# Patient Record
Sex: Female | Born: 1980 | Race: White | Hispanic: No | Marital: Married | Smoking: Never smoker
Health system: Southern US, Community
[De-identification: ages and names within clinical notes are randomized; demographics above are authoritative.]

## PROBLEM LIST (undated history)

## (undated) ENCOUNTER — Inpatient Hospital Stay (HOSPITAL_COMMUNITY): Payer: Self-pay

## (undated) DIAGNOSIS — F329 Major depressive disorder, single episode, unspecified: Secondary | ICD-10-CM

## (undated) DIAGNOSIS — R51 Headache: Secondary | ICD-10-CM

## (undated) DIAGNOSIS — R519 Headache, unspecified: Secondary | ICD-10-CM

## (undated) DIAGNOSIS — Z789 Other specified health status: Secondary | ICD-10-CM

## (undated) DIAGNOSIS — F32A Depression, unspecified: Secondary | ICD-10-CM

## (undated) HISTORY — PX: APPENDECTOMY: SHX54

## (undated) HISTORY — PX: WISDOM TOOTH EXTRACTION: SHX21

---

## 2015-06-14 ENCOUNTER — Encounter (HOSPITAL_COMMUNITY): Payer: Self-pay | Admitting: *Deleted

## 2015-06-14 ENCOUNTER — Emergency Department (HOSPITAL_COMMUNITY): Payer: Self-pay

## 2015-06-14 ENCOUNTER — Emergency Department (HOSPITAL_COMMUNITY)
Admission: EM | Admit: 2015-06-14 | Discharge: 2015-06-14 | Disposition: A | Discharge status: Home / Self Care | Payer: Self-pay | Attending: Emergency Medicine | Admitting: Emergency Medicine

## 2015-06-14 DIAGNOSIS — N939 Abnormal uterine and vaginal bleeding, unspecified: Secondary | ICD-10-CM

## 2015-06-14 DIAGNOSIS — O039 Complete or unspecified spontaneous abortion without complication: Secondary | ICD-10-CM | POA: Insufficient documentation

## 2015-06-14 DIAGNOSIS — Z3A01 Less than 8 weeks gestation of pregnancy: Secondary | ICD-10-CM | POA: Insufficient documentation

## 2015-06-14 LAB — CBC WITH DIFFERENTIAL/PLATELET
BASOS PCT: 0 % (ref 0–1)
Basophils Absolute: 0 10*3/uL (ref 0.0–0.1)
Eosinophils Absolute: 0.1 10*3/uL (ref 0.0–0.7)
Eosinophils Relative: 1 % (ref 0–5)
HEMATOCRIT: 38.7 % (ref 36.0–46.0)
HEMOGLOBIN: 13.3 g/dL (ref 12.0–15.0)
LYMPHS ABS: 1.5 10*3/uL (ref 0.7–4.0)
LYMPHS PCT: 17 % (ref 12–46)
MCH: 31.7 pg (ref 26.0–34.0)
MCHC: 34.4 g/dL (ref 30.0–36.0)
MCV: 92.1 fL (ref 78.0–100.0)
MONO ABS: 0.4 10*3/uL (ref 0.1–1.0)
Monocytes Relative: 5 % (ref 3–12)
Neutro Abs: 7.2 10*3/uL (ref 1.7–7.7)
Neutrophils Relative %: 77 % (ref 43–77)
Platelets: 275 10*3/uL (ref 150–400)
RBC: 4.2 MIL/uL (ref 3.87–5.11)
RDW: 12.1 % (ref 11.5–15.5)
WBC: 9.1 10*3/uL (ref 4.0–10.5)

## 2015-06-14 LAB — BASIC METABOLIC PANEL
Anion gap: 11 (ref 5–15)
BUN: 6 mg/dL (ref 6–20)
CALCIUM: 9.2 mg/dL (ref 8.9–10.3)
CHLORIDE: 105 mmol/L (ref 101–111)
CO2: 24 mmol/L (ref 22–32)
CREATININE: 0.69 mg/dL (ref 0.44–1.00)
GFR calc Af Amer: >60 mL/min (ref >60)
GFR calc non Af Amer: >60 mL/min (ref >60)
Glucose, Bld: 103 mg/dL — ABNORMAL HIGH (ref 65–99)
Potassium: 3.6 mmol/L (ref 3.5–5.1)
Sodium: 140 mmol/L (ref 135–145)

## 2015-06-14 LAB — I-STAT BETA HCG BLOOD, ED (MC, WL, AP ONLY): I-stat hCG, quantitative: >2000 m[IU]/mL — ABNORMAL HIGH (ref <5)

## 2015-06-14 LAB — HCG, QUANTITATIVE, PREGNANCY: HCG, BETA CHAIN, QUANT, S: 12374 m[IU]/mL — AB (ref <5)

## 2015-06-14 LAB — ABO/RH: ABO/RH(D): A POS

## 2015-06-14 MED ORDER — HYDROCODONE-ACETAMINOPHEN 5-325 MG PO TABS: 1.0000 | ORAL_TABLET | ORAL | Status: DC | PRN

## 2015-06-14 MED ORDER — NAPROXEN 500 MG PO TABS: 500.0000 mg | ORAL_TABLET | Freq: Two times a day (BID) | ORAL | Status: DC

## 2015-06-14 NOTE — ED Notes (Signed)
Pt reports bleeding and cramping starting on Tuesday. Pt states that she went to the restroom this morning and passed a large clot. Pt reports continued bleeding. Pt states last period was early June. Pt states that reports no OB appointment so far.

## 2015-06-14 NOTE — ED Notes (Signed)
Pt still in US

## 2015-06-14 NOTE — ED Notes (Signed)
Ultrasound called for status of ultrasound. States that she is next to go

## 2015-06-14 NOTE — ED Provider Notes (Signed)
CSN: [id number]     Arrival date & time 06/14/15  0825 History   First MD Initiated Contact with Patient 06/14/15 0830     Chief Complaint  Patient presents with  . Miscarriage   HPI Patient presents to the emergency room with complaints of vaginal bleeding.  Patient is G2 P0. Her last menstrual period was in the early part of June. Patient hasn't had a positive home pregnancy test. The patient has had some mild vaginal bleeding and cramping this past week. She does not have an OB doctor so she was monitoring it carefully. The symptoms were very mild so she was not that concerned. However, when she went to the restroom this morning she passed a large clot of blood. She also started having more serious lower abdominal cramping. The patient's concerned she is having a miscarriage. She denies any history of fevers or chills. No vomiting or diarrhea. History reviewed. No pertinent past medical history. History reviewed. No pertinent past surgical history. No family history on file. History  Substance Use Topics  . Smoking status: Never Smoker   . Smokeless tobacco: Not on file  . Alcohol Use: Not on file   OB History    No data available     Review of Systems  All other systems reviewed and are negative.     Allergies  Review of patient's allergies indicates no known allergies.  Home Medications   Prior to Admission medications   Medication Sig Start Date End Date Taking? Authorizing Provider  acetaminophen (TYLENOL) 325 MG tablet Take 650 mg by mouth every 6 (six) hours as needed for mild pain.   Yes Historical Provider, MD  HYDROcodone-acetaminophen (NORCO/VICODIN) 5-325 MG per tablet Take 1-2 tablets by mouth every 4 (four) hours as needed. 06/14/15   Linwood Dibbles, MD  naproxen (NAPROSYN) 500 MG tablet Take 1 tablet (500 mg total) by mouth 2 (two) times daily. 06/14/15   Linwood Dibbles, MD   BP 115/68 mmHg  Pulse 91  Temp(Src) 98.4 F (36.9 C) (Oral)  Resp 14  SpO2 100%  LMP  04/25/2015 Physical Exam  Constitutional: She appears well-developed and well-nourished. No distress.  HENT:  Head: Normocephalic and atraumatic.  Right Ear: External ear normal.  Left Ear: External ear normal.  Eyes: Conjunctivae are normal. Right eye exhibits no discharge. Left eye exhibits no discharge. No scleral icterus.  Neck: Neck supple. No tracheal deviation present.  Cardiovascular: Normal rate, regular rhythm and intact distal pulses.   Pulmonary/Chest: Effort normal and breath sounds normal. No stridor. No respiratory distress. She has no wheezes. She has no rales.  Abdominal: Soft. Bowel sounds are normal. She exhibits no distension. There is no tenderness. There is no rebound and no guarding.  Genitourinary: Uterus is tender. Right adnexum displays no mass. Left adnexum displays no mass. There is bleeding in the vagina.  Cervical os is opened, there appeared to be some tissue within the cervical os, several clots within the vaginal vault, small amount of actively oozing blood from the cervical os  Musculoskeletal: She exhibits no edema or tenderness.  Neurological: She is alert. She has normal strength. No cranial nerve deficit (no facial droop, extraocular movements intact, no slurred speech) or sensory deficit. She exhibits normal muscle tone. She displays no seizure activity. Coordination normal.  Skin: Skin is warm and dry. No rash noted.  Psychiatric: She has a normal mood and affect.  Nursing note and vitals reviewed.   ED Course  Procedures (including critical  care time) Labs Review Labs Reviewed  HCG, QUANTITATIVE, PREGNANCY - Abnormal; Notable for the following:    hCG, Beta Chain, Quant, S [id number] (*)    All other components within normal limits  BASIC METABOLIC PANEL - Abnormal; Notable for the following:    Glucose, Bld 103 (*)    All other components within normal limits  I-STAT BETA HCG BLOOD, ED ([facility], [facility], [facility] ONLY) - Abnormal; Notable for the following:     I-stat hCG, quantitative >2000.0 (*)    All other components within normal limits  CBC WITH DIFFERENTIAL/PLATELET  ABO/RH  SURGICAL PATHOLOGY    Imaging Review US Ob Comp Less 14 Wks  06/14/2015   CLINICAL DATA:  Pregnant patient with bleeding and cramping since 06/10/2015. Quantitative HCG 12,374. Initial encounter.  EXAM: OBSTETRIC <14 WK [place] AND TRANSVAGINAL OB US  TECHNIQUE: Both transabdominal and transvaginal ultrasound examinations were performed for complete evaluation of the gestation as well as the maternal uterus, adnexal regions, and pelvic cul-de-sac. Transvaginal technique was performed to assess early pregnancy.  COMPARISON:  None.  FINDINGS: Intrauterine gestational sac: Not visualized. Endometrial stripe thickness is 13 mm.  Yolk sac:  Not visualized.  Embryo:  Not visualized.  Cardiac Activity: Not applicable.  Maternal uterus/adnexae: Appear normal.  IMPRESSION: No ultrasound evidence of pregnancy most suggestive of completed abortion. Follow-up quantitative HCG is recommended.   Electronically Signed   By: Drusilla Kanner M.D.   On: 06/14/2015 11:51   US Ob Transvaginal  06/14/2015   CLINICAL DATA:  Pregnant patient with bleeding and cramping since 06/10/2015. Quantitative HCG 12,374. Initial encounter.  EXAM: OBSTETRIC <14 WK [place] AND TRANSVAGINAL OB US  TECHNIQUE: Both transabdominal and transvaginal ultrasound examinations were performed for complete evaluation of the gestation as well as the maternal uterus, adnexal regions, and pelvic cul-de-sac. Transvaginal technique was performed to assess early pregnancy.  COMPARISON:  None.  FINDINGS: Intrauterine gestational sac: Not visualized. Endometrial stripe thickness is 13 mm.  Yolk sac:  Not visualized.  Embryo:  Not visualized.  Cardiac Activity: Not applicable.  Maternal uterus/adnexae: Appear normal.  IMPRESSION: No ultrasound evidence of pregnancy most suggestive of completed abortion. Follow-up quantitative HCG is recommended.    Electronically Signed   By: Drusilla Kanner M.D.   On: 06/14/2015 11:51      MDM   Final diagnoses:  Complete miscarriage    The patient's exam and ultrasound is most consistent with a completed miscarriage. Patient did pass tissue that we saved as a specimen and sent to pathology for evaluation. The patient's bleeding had slowed down during her stay in the emergency department.  I spoke with Victorino Dike NP at [facility] hospital working with Dr Debroah Loop.  Will have pt follow up on Sunday at [facility]U for repeat quant.   At this time there does not appear to be any evidence of an acute emergency medical condition and the patient appears stable for discharge with appropriate outpatient follow up.     Linwood Dibbles, MD 06/14/15 1240

## 2015-06-14 NOTE — Discharge Instructions (Signed)
Miscarriage A miscarriage is the sudden loss of an unborn baby (fetus) before the 20th week of pregnancy. Most miscarriages happen in the first 3 months of pregnancy. Sometimes, it happens before a woman even knows she is pregnant. A miscarriage is also called a "spontaneous miscarriage" or "early pregnancy loss." Having a miscarriage can be an emotional experience. Talk with your caregiver about any questions you may have about miscarrying, the grieving process, and your future pregnancy plans. CAUSES   Problems with the fetal chromosomes that make it impossible for the baby to develop normally. Problems with the baby's genes or chromosomes are most often the result of errors that occur, by chance, as the embryo divides and grows. The problems are not inherited from the parents.  Infection of the cervix or uterus.   Hormone problems.   Problems with the cervix, such as having an incompetent cervix. This is when the tissue in the cervix is not strong enough to hold the pregnancy.   Problems with the uterus, such as an abnormally shaped uterus, uterine fibroids, or congenital abnormalities.   Certain medical conditions.   Smoking, drinking alcohol, or taking illegal drugs.   Trauma.  Often, the cause of a miscarriage is unknown.  SYMPTOMS   Vaginal bleeding or spotting, with or without cramps or pain.  Pain or cramping in the abdomen or lower back.  Passing fluid, tissue, or blood clots from the vagina. DIAGNOSIS  Your caregiver will perform a physical exam. You may also have an ultrasound to confirm the miscarriage. Blood or urine tests may also be ordered. TREATMENT   Sometimes, treatment is not necessary if you naturally pass all the fetal tissue that was in the uterus. If some of the fetus or placenta remains in the body (incomplete miscarriage), tissue left behind may become infected and must be removed. Usually, a dilation and curettage (D and C) procedure is performed.  During a D and C procedure, the cervix is widened (dilated) and any remaining fetal or placental tissue is gently removed from the uterus.  Antibiotic medicines are prescribed if there is an infection. Other medicines may be given to reduce the size of the uterus (contract) if there is a lot of bleeding.  If you have Rh negative blood and your baby was Rh positive, you will need a Rh immunoglobulin shot. This shot will protect any future baby from having Rh blood problems in future pregnancies. HOME CARE INSTRUCTIONS   Your caregiver may order bed rest or may allow you to continue light activity. Resume activity as directed by your caregiver.  Have someone help with home and family responsibilities during this time.   Keep track of the number of sanitary pads you use each day and how soaked (saturated) they are. Write down this information.   Do not use tampons. Do not douche or have sexual intercourse until approved by your caregiver.   Only take over-the-counter or prescription medicines for pain or discomfort as directed by your caregiver.   Do not take aspirin. Aspirin can cause bleeding.   Keep all follow-up appointments with your caregiver.   If you or your partner have problems with grieving, talk to your caregiver or seek counseling to help cope with the pregnancy loss. Allow enough time to grieve before trying to get pregnant again.  SEEK IMMEDIATE MEDICAL CARE IF:   You have severe cramps or pain in your back or abdomen.  You have a fever.  You pass large blood clots (walnut-sized   or larger) ortissue from your vagina. Save any tissue for your caregiver to inspect.   Your bleeding increases.   You have a thick, bad-smelling vaginal discharge.  You become lightheaded, weak, or you faint.   You have chills.  MAKE SURE YOU:  Understand these instructions.  Will watch your condition.  Will get help right away if you are not doing well or get  worse. Document Released: 05/05/2001 Document Revised: 03/06/2013 Document Reviewed: 12/29/2011 Exit[employer] Patient Information 2015 [employer]. This information is not intended to replace advice given to you by your health care provider. Make sure you discuss any questions you have with your health care provider.  

## 2015-11-13 LAB — OB RESULTS CONSOLE RUBELLA ANTIBODY, IGM: RUBELLA: IMMUNE

## 2015-11-13 LAB — OB RESULTS CONSOLE HEPATITIS B SURFACE ANTIGEN: Hepatitis B Surface Ag: NEGATIVE

## 2015-11-13 LAB — OB RESULTS CONSOLE GC/CHLAMYDIA
Chlamydia: NEGATIVE
Gonorrhea: NEGATIVE

## 2015-11-13 LAB — OB RESULTS CONSOLE ABO/RH: RH TYPE: POSITIVE

## 2015-11-13 LAB — OB RESULTS CONSOLE HIV ANTIBODY (ROUTINE TESTING): HIV: NONREACTIVE

## 2015-11-13 LAB — OB RESULTS CONSOLE RPR: RPR: NONREACTIVE

## 2015-11-13 LAB — OB RESULTS CONSOLE ANTIBODY SCREEN: Antibody Screen: NEGATIVE

## 2015-12-07 ENCOUNTER — Encounter (HOSPITAL_COMMUNITY): Payer: Self-pay

## 2015-12-07 ENCOUNTER — Inpatient Hospital Stay (HOSPITAL_COMMUNITY)
Admission: AD | Admit: 2015-12-07 | Discharge: 2015-12-07 | Disposition: A | Discharge status: Home / Self Care | Payer: Medicaid Other | Source: Ambulatory Visit | Attending: Obstetrics & Gynecology | Admitting: Obstetrics & Gynecology

## 2015-12-07 ENCOUNTER — Inpatient Hospital Stay (HOSPITAL_COMMUNITY): Payer: Medicaid Other

## 2015-12-07 DIAGNOSIS — Z3A12 12 weeks gestation of pregnancy: Secondary | ICD-10-CM | POA: Insufficient documentation

## 2015-12-07 DIAGNOSIS — O23591 Infection of other part of genital tract in pregnancy, first trimester: Secondary | ICD-10-CM | POA: Diagnosis not present

## 2015-12-07 DIAGNOSIS — N760 Acute vaginitis: Secondary | ICD-10-CM | POA: Insufficient documentation

## 2015-12-07 DIAGNOSIS — O209 Hemorrhage in early pregnancy, unspecified: Secondary | ICD-10-CM

## 2015-12-07 DIAGNOSIS — B9689 Other specified bacterial agents as the cause of diseases classified elsewhere: Secondary | ICD-10-CM

## 2015-12-07 HISTORY — DX: Other specified health status: Z78.9

## 2015-12-07 LAB — CBC WITH DIFFERENTIAL/PLATELET
BASOS PCT: 0 %
Basophils Absolute: 0 10*3/uL (ref 0.0–0.1)
Eosinophils Absolute: 0 10*3/uL (ref 0.0–0.7)
Eosinophils Relative: 0 %
HEMATOCRIT: 35.6 % — AB (ref 36.0–46.0)
HEMOGLOBIN: 12.5 g/dL (ref 12.0–15.0)
LYMPHS PCT: 24 %
Lymphs Abs: 2.3 10*3/uL (ref 0.7–4.0)
MCH: 31.7 pg (ref 26.0–34.0)
MCHC: 35.1 g/dL (ref 30.0–36.0)
MCV: 90.4 fL (ref 78.0–100.0)
MONO ABS: 0.4 10*3/uL (ref 0.1–1.0)
Monocytes Relative: 4 %
NEUTROS PCT: 72 %
Neutro Abs: 7.1 10*3/uL (ref 1.7–7.7)
Platelets: 298 10*3/uL (ref 150–400)
RBC: 3.94 MIL/uL (ref 3.87–5.11)
RDW: 12.6 % (ref 11.5–15.5)
WBC: 9.9 10*3/uL (ref 4.0–10.5)

## 2015-12-07 LAB — URINALYSIS, ROUTINE W REFLEX MICROSCOPIC
Bilirubin Urine: NEGATIVE
Glucose, UA: NEGATIVE mg/dL
HGB URINE DIPSTICK: NEGATIVE
Ketones, ur: 40 mg/dL — AB
Leukocytes, UA: NEGATIVE
NITRITE: NEGATIVE
PROTEIN: NEGATIVE mg/dL
SPECIFIC GRAVITY, URINE: 1.015 (ref 1.005–1.030)
pH: 6 (ref 5.0–8.0)

## 2015-12-07 LAB — WET PREP, GENITAL
CLUE CELLS WET PREP: NONE SEEN
Sperm: NONE SEEN
Trich, Wet Prep: NONE SEEN
YEAST WET PREP: NONE SEEN

## 2015-12-07 LAB — HCG, QUANTITATIVE, PREGNANCY: HCG, BETA CHAIN, QUANT, S: 37792 m[IU]/mL — AB (ref <5)

## 2015-12-07 MED ORDER — METRONIDAZOLE 500 MG PO TABS: 500.0000 mg | ORAL_TABLET | Freq: Two times a day (BID) | ORAL | Status: AC

## 2015-12-07 NOTE — MAU Note (Signed)
Had sex this morning, later in day noticed towel from this morning had small amount of blood on it.  Started having abd cramping at 4 pm.  Had ultrasound yesterday.

## 2015-12-07 NOTE — MAU Provider Note (Signed)
History  35 yo G3P1011 @ 12.2 wks by LMP (EDD 06/18/16) presents to MAU after calling w/ c/o intermittent bleeding and cramping since 4 pm today. Admits to increased activity today and intercourse this morning. Reveals h/o complete AB on 22 Jul 16.  Patient Active Problem List   Diagnosis Date Noted  . Bacterial vaginosis 12/07/2015    Chief Complaint  Patient presents with  . Vaginal Bleeding  . Abdominal Pain   HPI As above OB History    Gravida Para Term Preterm AB TAB SAB Ectopic Multiple Living   2 1 1       1       Past Medical History  Diagnosis Date  . Medical history non-contributory     Past Surgical History  Procedure Laterality Date  . Appendectomy    . Wisdom tooth extraction      History reviewed. No pertinent family history.  Social History  Substance Use Topics  . Smoking status: Never Smoker   . Smokeless tobacco: None  . Alcohol Use: No    Allergies: No Known Allergies  Prescriptions prior to admission  Medication Sig Dispense Refill Last Dose  . acetaminophen (TYLENOL) 325 MG tablet Take 650 mg by mouth every 6 (six) hours as needed for mild pain or headache.    Past Month at Unknown time  . Prenatal Vit-Fe Fumarate-FA (PRENATAL MULTIVITAMIN) TABS tablet Take 1 tablet by mouth daily.    12/07/2015 at Unknown time  . HYDROcodone-acetaminophen (NORCO/VICODIN) 5-325 MG per tablet Take 1-2 tablets by mouth every 4 (four) hours as needed. (Patient not taking: Reported on 12/07/2015) 16 tablet 0   . naproxen (NAPROSYN) 500 MG tablet Take 1 tablet (500 mg total) by mouth 2 (two) times daily. (Patient not taking: Reported on 12/07/2015) 30 tablet 0     ROS  Spotting Cramping Physical Exam  CLINICAL DATA: 35 year old pregnant female with postcoital spotting  EXAM: OBSTETRIC <14 WK ULTRASOUND  TECHNIQUE: Transabdominal ultrasound was performed for evaluation of the gestation as well as the maternal uterus and adnexal regions.  COMPARISON:  None.  FINDINGS: Intrauterine gestational sac: Single intrauterine gestational sac.  Yolk sac: Not seen  Embryo: Present  Cardiac Activity: Detected  Heart Rate: 158 bpm  CRL: 61 mm 12 w 4d [place] EDC: 06/16/2016  Subchorionic hemorrhage: None visualized.  Maternal uterus/adnexae: The right ovary is unremarkable. The left ovary is not visualized. No free fluid is noted within the pelvis.  IMPRESSION: Single live intrauterine pregnancy with an estimated gestational age of [redacted] weeks, 4 days.   Electronically Signed  By: Elgie Collard M.D.  On: 12/07/2015 22:05  Results for orders placed or performed during the hospital encounter of 12/07/15 (from the past 24 hour(s))  Urinalysis, Routine w reflex microscopic (not at [facility])     Status: Abnormal   Collection Time: 12/07/15  8:48 PM  Result Value Ref Range   Color, Urine YELLOW YELLOW   APPearance CLEAR CLEAR   Specific Gravity, Urine 1.015 1.005 - 1.030   pH 6.0 5.0 - 8.0   Glucose, UA NEGATIVE NEGATIVE mg/dL   Hgb urine dipstick NEGATIVE NEGATIVE   Bilirubin Urine NEGATIVE NEGATIVE   Ketones, ur 40 (A) NEGATIVE mg/dL   Protein, ur NEGATIVE NEGATIVE mg/dL   Nitrite NEGATIVE NEGATIVE   Leukocytes, UA NEGATIVE NEGATIVE  Wet prep, genital     Status: Abnormal   Collection Time: 12/07/15  9:06 PM  Result Value Ref Range   Yeast Wet Prep HPF POC NONE  SEEN NONE SEEN   Trich, Wet Prep NONE SEEN NONE SEEN   Clue Cells Wet Prep HPF POC NONE SEEN NONE SEEN   WBC, Wet Prep HPF POC MODERATE (A) NONE SEEN   Sperm NONE SEEN   hCG, quantitative, pregnancy     Status: Abnormal   Collection Time: 12/07/15  9:20 PM  Result Value Ref Range   hCG, Beta Chain, Quant, S [id number] 379 [id number] (H) <5 mIU/mL  CBC with Differential/Platelet     Status: Abnormal   Collection Time: 12/07/15  9:20 PM  Result Value Ref Range   WBC 9.9 4.0 - 10.5 K/uL   RBC 3.94 3.87 - 5.11 MIL/uL   Hemoglobin 12.5 12.0 - 15.0 g/dL   HCT  60.435.6 (L) 54.036.0 - 46.0 %   MCV 90.4 78.0 - 100.0 fL   MCH 31.7 26.0 - 34.0 pg   MCHC 35.1 30.0 - 36.0 g/dL   RDW 98.112.6 19.111.5 - 47.815.5 %   Platelets 298 150 - 400 K/uL   Neutrophils Relative % 72 %   Neutro Abs 7.1 1.7 - 7.7 K/uL   Lymphocytes Relative 24 %   Lymphs Abs 2.3 0.7 - 4.0 K/uL   Monocytes Relative 4 %   Monocytes Absolute 0.4 0.1 - 1.0 K/uL   Eosinophils Relative 0 %   Eosinophils Absolute 0.0 0.0 - 0.7 K/uL   Basophils Relative 0 %   Basophils Absolute 0.0 0.0 - 0.1 K/uL    Blood pressure 118/75, pulse 83, temperature 98.6 F (37 C), temperature source Oral, resp. rate 16, height 5\' 7"  (1.702 m), weight 67.302 kg (148 lb 6 oz), last menstrual period 04/25/2015.  Physical Exam Gen: Anxious Lungs: CTAB CV: RRR w/o M/R/G Abdomen: gravid, soft, NT, no guarding or rebound Speculum: Scant amount of tan, malodorous discharge in vault, no active bleeding. Sample collected for wet prep. +Whiff. GC/CT ordered. Pelvic: c/w 12 wk size, non tender, no CMT or adnexal tenderness/mass. Cvx closed.  Ext: WNL Doptones: Not detected - sent for u/s ED Course  UA CBC Quants Wet prep GC/CT U/S  Assessment: BV and postcoital bleeding at 12.[redacted] wk gestation. U/S as above - SIUP, c/w LMP dating. No SCH. FHR 158 bpm.  Plan: Reassurance. Metronidazole. Reviewed BV in detail and its association w/ adverse pregnancy outcome. Strict SAB precautions. Pelvic rest. Hydrate. OB f/u as scheduled.    Sherre ScarletWILLIAMS, Roxann Vierra CNM, MS 12/07/2015 9:57 PM

## 2015-12-07 NOTE — Discharge Instructions (Signed)

## 2015-12-09 LAB — GC/CHLAMYDIA PROBE AMP (~~LOC~~) NOT AT ARMC
Chlamydia: NEGATIVE
Neisseria Gonorrhea: NEGATIVE

## 2015-12-31 ENCOUNTER — Inpatient Hospital Stay (HOSPITAL_COMMUNITY)
Admission: AD | Admit: 2015-12-31 | Discharge: 2015-12-31 | Disposition: A | Discharge status: Home / Self Care | Payer: Medicaid Other | Source: Ambulatory Visit | Attending: Obstetrics and Gynecology | Admitting: Obstetrics and Gynecology

## 2015-12-31 ENCOUNTER — Inpatient Hospital Stay (HOSPITAL_COMMUNITY): Payer: Medicaid Other

## 2015-12-31 ENCOUNTER — Other Ambulatory Visit: Payer: Self-pay | Admitting: Obstetrics and Gynecology

## 2015-12-31 ENCOUNTER — Encounter (HOSPITAL_COMMUNITY): Payer: Self-pay

## 2015-12-31 DIAGNOSIS — N760 Acute vaginitis: Secondary | ICD-10-CM | POA: Insufficient documentation

## 2015-12-31 DIAGNOSIS — F329 Major depressive disorder, single episode, unspecified: Secondary | ICD-10-CM | POA: Diagnosis not present

## 2015-12-31 DIAGNOSIS — O4692 Antepartum hemorrhage, unspecified, second trimester: Secondary | ICD-10-CM

## 2015-12-31 DIAGNOSIS — Z3A15 15 weeks gestation of pregnancy: Secondary | ICD-10-CM | POA: Diagnosis not present

## 2015-12-31 DIAGNOSIS — O208 Other hemorrhage in early pregnancy: Secondary | ICD-10-CM | POA: Diagnosis present

## 2015-12-31 DIAGNOSIS — F419 Anxiety disorder, unspecified: Secondary | ICD-10-CM | POA: Insufficient documentation

## 2015-12-31 HISTORY — DX: Major depressive disorder, single episode, unspecified: F32.9

## 2015-12-31 HISTORY — DX: Depression, unspecified: F32.A

## 2015-12-31 LAB — WET PREP, GENITAL
CLUE CELLS WET PREP: NONE SEEN
Sperm: NONE SEEN
TRICH WET PREP: NONE SEEN
Yeast Wet Prep HPF POC: NONE SEEN

## 2015-12-31 LAB — URINALYSIS, ROUTINE W REFLEX MICROSCOPIC
BILIRUBIN URINE: NEGATIVE
Glucose, UA: NEGATIVE mg/dL
Ketones, ur: 15 mg/dL — AB
NITRITE: NEGATIVE
Protein, ur: NEGATIVE mg/dL
pH: 6 (ref 5.0–8.0)

## 2015-12-31 LAB — URINE MICROSCOPIC-ADD ON

## 2015-12-31 NOTE — Discharge Instructions (Signed)
Vaginal Bleeding During Pregnancy, Second Trimester ° A small amount of bleeding (spotting) from the vagina is common in pregnancy. Sometimes the bleeding is normal and is not a problem, and sometimes it is a sign of something serious. Be sure to tell your doctor about any bleeding from your vagina right away. °HOME CARE °· Watch your condition for any changes. °· Follow your doctor's instructions about how active you can be. °· If you are on bed rest: °· You may need to stay in bed and only get up to use the bathroom. °· You may be allowed to do some activities. °· If you need help, make plans for someone to help you. °· Write down: °· The number of pads you use each day. °· How often you change pads. °· How soaked (saturated) your pads are. °· Do not use tampons. °· Do not douche. °· Do not have sex or orgasms until your doctor says it is okay. °· If you pass any tissue from your vagina, save the tissue so you can show it to your doctor. °· Only take medicines as told by your doctor. °· Do not take aspirin because it can make you bleed. °· Do not exercise, lift heavy weights, or do any activities that take a lot of energy and effort unless your doctor says it is okay. °· Keep all follow-up visits as told by your doctor. °GET HELP IF:  °· You bleed from your vagina. °· You have cramps. °· You have labor pains. °· You have a fever that does not go away after you take medicine. °GET HELP RIGHT AWAY IF: °· You have very bad cramps in your back or belly (abdomen). °· You have contractions. °· You have chills. °· You pass large clots or tissue from your vagina. °· You bleed more. °· You feel light-headed or weak. °· You pass out (faint). °· You are leaking fluid or have a gush of fluid from your vagina. °MAKE SURE YOU: °· Understand these instructions. °· Will watch your condition. °· Will get help right away if you are not doing well or get worse. °  °This information is not intended to replace advice given to you by  your health care provider. Make sure you discuss any questions you have with your health care provider. °  °Document Released: 03/26/2014 Document Reviewed: 03/26/2014 °Elsevier Interactive Patient Education ©2016 [employer] ° °Pelvic Rest °Pelvic rest is sometimes recommended for women when:  °· The placenta is partially or completely covering the opening of the cervix (placenta previa). °· There is bleeding between the uterine wall and the amniotic sac in the first trimester (subchorionic hemorrhage). °· The cervix begins to open without labor starting (incompetent cervix, cervical insufficiency). °· The labor is too early (preterm labor). °HOME CARE INSTRUCTIONS °· Do not have sexual intercourse, stimulation, or an orgasm. °· Do not use tampons, douche, or put anything in the vagina. °· Do not lift anything over 10 pounds (4.5 kg). °· Avoid strenuous activity or straining your pelvic muscles. °SEEK MEDICAL CARE IF:  °· You have any vaginal bleeding during pregnancy. Treat this as a potential emergency. °· You have cramping pain felt low in the stomach (stronger than menstrual cramps). °· You notice vaginal discharge (watery, mucus, or bloody). °· You have a low, dull backache. °· There are regular contractions or uterine tightening. °SEEK IMMEDIATE MEDICAL CARE IF: °You have vaginal bleeding and have placenta previa.  °  °This information is not intended to   replace advice given to you by your health care provider. Make sure you discuss any questions you have with your health care provider. °  °Document Released: 03/06/2011 Document Revised: 02/01/2012 Document Reviewed: 05/13/2015 °Elsevier Interactive Patient Education ©2016 [employer] ° °

## 2015-12-31 NOTE — MAU Provider Note (Signed)
History    Jessica Hayden is a 35y.o. G3P1011 at 15.5wks who presents, after phone call, for bleeding.  Patient states she noted the bleeding after work around 1800.  Patient reports blood was noted upon wiping and in the toilet.  Patient states blood was not bright red and "relates it to the tail end of a menstrual period when it starts to lighten."  Patient denies cramping, but reports some abdominal tightening.  Patient states that she was recently treated for BV and completed the treatment about 1-2 weeks ago.  Patient denies constipation or problems with urination.  Patient reports sexual intercourse last night and notes that cramping started this morning around 0800.  Patient states that cramping has since subsided and no bleeding noted upon going to the restroom.    Patient Active Problem List   Diagnosis Date Noted  . Bacterial vaginosis 12/07/2015    No chief complaint on file.  HPI  OB History    Gravida Para Term Preterm AB TAB SAB Ectopic Multiple Living   Past Medical History  Diagnosis Date  . Medical history non-contributory   . Depression     Past Surgical History  Procedure Laterality Date  . Appendectomy    . Wisdom tooth extraction      No family history on file.  Social History  Substance Use Topics  . Smoking status: Never Smoker   . Smokeless tobacco: None  . Alcohol Use: No    Allergies: No Known Allergies  Prescriptions prior to admission  Medication Sig Dispense Refill Last Dose  . acetaminophen (TYLENOL) 325 MG tablet Take 650 mg by mouth every 6 (six) hours as needed for mild pain or headache.    Past Month at Unknown time  . metroNIDAZOLE (FLAGYL) 500 MG tablet Take 500 mg by mouth 2 (two) times daily.   Past Month at Unknown time  . Prenatal Vit-Fe Fumarate-FA (PRENATAL MULTIVITAMIN) TABS tablet Take 1 tablet by mouth daily.    12/31/2015 at Unknown time  . sertraline (ZOLOFT) 50 MG tablet Take 50 mg by mouth daily.    12/30/2015 at Unknown time    ROS  See HPI Above Physical Exam   Blood pressure 133/74, pulse 95, temperature 98.4 F (36.9 C), temperature source Oral, resp. rate 16, height  (1.702 m), weight 70.308 kg (155 lb), last menstrual period 04/25/2015, SpO2 100 %.  No results found for this or any previous visit (from the past 24 hour(s)).  Physical Exam  Constitutional: She is oriented to person, place, and time. She appears well-developed and well-nourished. No distress.  HENT:  Head: Normocephalic and atraumatic.  Eyes: Conjunctivae are normal.  Neck: Normal range of motion.  Cardiovascular: Normal rate.   Respiratory: Effort normal.  GI: Soft.  Genitourinary: Uterus is enlarged (Consistent with GA). Cervix exhibits motion tenderness, discharge and friability. There is bleeding in the vagina. No vaginal discharge found.  Sterile Speculum Exam: -Vaginal Vault: Moderate amt reddish-brown frothy bloody discharge -wet prep collected -Cervix:Blood tinged mucoid discharge from os-GC/CT collected *Small mass noted within external os, probable nabothian cyst--friable when touched -Bimanual Exam: UTD d/t patient discomfort  Musculoskeletal: Normal range of motion.  Neurological: She is alert and oriented to person, place, and time.     FHR:150 by doppler US: SIUP; FHR 146, Cephalic, AFV-SWNL, CL-3.64cm with normal appearing cervix, Uterus and Ovaries WNL, Adnexa No abnormality visualized ED Course  Assessment: IUP at 15.5wks Vaginal Bleeding Anxiety  Plan: -VE as above -Discussed exam findings -Patient admits to being on pelvic rest, per last appt, but states she was told okay to resume sex after BV treatment -Limited US including CL  Follow Up (2220) -[place] as above -In room to discuss results -Educated on need for reiteration of pelvic rest -No Q/C -Keep appt as scheduled -Encouraged to call if any questions or concerns arise prior to next scheduled office visit.   -Discharged to home in stable condition  Cherre Robins CNM, MSN 12/31/2015 7:50 PM

## 2015-12-31 NOTE — MAU Note (Signed)
Pt states she started Zoloft last pm and today she started having lower abd tightness, then this pm she went to the restroom and noted vaginal bleeding.

## 2016-01-01 LAB — GC/CHLAMYDIA PROBE AMP (~~LOC~~) NOT AT ARMC
Chlamydia: NEGATIVE
NEISSERIA GONORRHEA: NEGATIVE

## 2016-05-22 LAB — OB RESULTS CONSOLE GBS: GBS: NEGATIVE

## 2016-06-18 ENCOUNTER — Encounter (HOSPITAL_COMMUNITY)
Admission: AD | Disposition: A | Discharge status: Home / Self Care | Payer: Self-pay | Source: Ambulatory Visit | Attending: Obstetrics and Gynecology

## 2016-06-18 ENCOUNTER — Encounter (HOSPITAL_COMMUNITY): Payer: Self-pay | Admitting: *Deleted

## 2016-06-18 ENCOUNTER — Inpatient Hospital Stay (HOSPITAL_COMMUNITY): Payer: Medicaid Other | Admitting: Anesthesiology

## 2016-06-18 ENCOUNTER — Inpatient Hospital Stay (HOSPITAL_COMMUNITY)
Admission: AD | Admit: 2016-06-18 | Discharge: 2016-06-21 | DRG: 766 | Disposition: A | Discharge status: Home / Self Care | Payer: Medicaid Other | Source: Ambulatory Visit | Attending: Obstetrics and Gynecology | Admitting: Obstetrics and Gynecology

## 2016-06-18 DIAGNOSIS — O133 Gestational [pregnancy-induced] hypertension without significant proteinuria, third trimester: Secondary | ICD-10-CM | POA: Diagnosis not present

## 2016-06-18 DIAGNOSIS — Z98891 History of uterine scar from previous surgery: Secondary | ICD-10-CM

## 2016-06-18 DIAGNOSIS — O321XX0 Maternal care for breech presentation, not applicable or unspecified: Principal | ICD-10-CM | POA: Diagnosis present

## 2016-06-18 DIAGNOSIS — O134 Gestational [pregnancy-induced] hypertension without significant proteinuria, complicating childbirth: Secondary | ICD-10-CM | POA: Diagnosis present

## 2016-06-18 DIAGNOSIS — O139 Gestational [pregnancy-induced] hypertension without significant proteinuria, unspecified trimester: Secondary | ICD-10-CM | POA: Diagnosis present

## 2016-06-18 DIAGNOSIS — Z3A40 40 weeks gestation of pregnancy: Secondary | ICD-10-CM

## 2016-06-18 HISTORY — DX: Headache: R51

## 2016-06-18 HISTORY — DX: Headache, unspecified: R51.9

## 2016-06-18 LAB — CBC
HCT: 33.7 % — ABNORMAL LOW (ref 36.0–46.0)
HCT: 32.8 % — ABNORMAL LOW (ref 36.0–46.0)
Hemoglobin: 11.7 g/dL — ABNORMAL LOW (ref 12.0–15.0)
Hemoglobin: 11.6 g/dL — ABNORMAL LOW (ref 12.0–15.0)
MCH: 31.2 pg (ref 26.0–34.0)
MCH: 31.6 pg (ref 26.0–34.0)
MCHC: 34.7 g/dL (ref 30.0–36.0)
MCHC: 35.4 g/dL (ref 30.0–36.0)
MCV: 89.9 fL (ref 78.0–100.0)
MCV: 89.4 fL (ref 78.0–100.0)
PLATELETS: 211 10*3/uL (ref 150–400)
PLATELETS: 223 10*3/uL (ref 150–400)
RBC: 3.75 MIL/uL — ABNORMAL LOW (ref 3.87–5.11)
RBC: 3.67 MIL/uL — ABNORMAL LOW (ref 3.87–5.11)
RDW: 14.2 % (ref 11.5–15.5)
RDW: 14.3 % (ref 11.5–15.5)
WBC: 11.8 10*3/uL — AB (ref 4.0–10.5)
WBC: 12.4 10*3/uL — ABNORMAL HIGH (ref 4.0–10.5)

## 2016-06-18 LAB — PROTEIN / CREATININE RATIO, URINE
CREATININE, URINE: 27 mg/dL
Total Protein, Urine: <6 mg/dL

## 2016-06-18 LAB — URINALYSIS, ROUTINE W REFLEX MICROSCOPIC
BILIRUBIN URINE: NEGATIVE
Glucose, UA: NEGATIVE mg/dL
KETONES UR: NEGATIVE mg/dL
Leukocytes, UA: NEGATIVE
Nitrite: NEGATIVE
Protein, ur: NEGATIVE mg/dL
Specific Gravity, Urine: <1.005 — ABNORMAL LOW (ref 1.005–1.030)
pH: 6 (ref 5.0–8.0)

## 2016-06-18 LAB — COMPREHENSIVE METABOLIC PANEL
ALBUMIN: 3.2 g/dL — AB (ref 3.5–5.0)
ALT: 26 U/L (ref 14–54)
AST: 36 U/L (ref 15–41)
Alkaline Phosphatase: 156 U/L — ABNORMAL HIGH (ref 38–126)
Anion gap: 9 (ref 5–15)
CHLORIDE: 107 mmol/L (ref 101–111)
CO2: 22 mmol/L (ref 22–32)
Calcium: 9.1 mg/dL (ref 8.9–10.3)
Creatinine, Ser: 0.58 mg/dL (ref 0.44–1.00)
GFR calc Af Amer: >60 mL/min (ref >60)
GFR calc non Af Amer: >60 mL/min (ref >60)
GLUCOSE: 117 mg/dL — AB (ref 65–99)
POTASSIUM: 3 mmol/L — AB (ref 3.5–5.1)
Sodium: 138 mmol/L (ref 135–145)
Total Bilirubin: 0.8 mg/dL (ref 0.3–1.2)
Total Protein: 6.6 g/dL (ref 6.5–8.1)

## 2016-06-18 LAB — URINE MICROSCOPIC-ADD ON: RBC / HPF: NONE SEEN RBC/hpf (ref 0–5)

## 2016-06-18 LAB — TYPE AND SCREEN
ABO/RH(D): A POS
Antibody Screen: NEGATIVE

## 2016-06-18 SURGERY — Surgical Case
Anesthesia: Epidural

## 2016-06-18 MED ORDER — OXYCODONE-ACETAMINOPHEN 5-325 MG PO TABS: 2.0000 | ORAL_TABLET | ORAL | Status: DC | PRN

## 2016-06-18 MED ORDER — SODIUM BICARBONATE 8.4 % IV SOLN
INTRAVENOUS | Status: AC
  Filled 2016-06-18: qty 50

## 2016-06-18 MED ORDER — LACTATED RINGERS IV SOLN
INTRAVENOUS | Status: DC
  Administered 2016-06-18 – 2016-06-19 (×4): via INTRAVENOUS

## 2016-06-18 MED ORDER — MORPHINE SULFATE (PF) 0.5 MG/ML IJ SOLN
INTRAMUSCULAR | Status: DC | PRN
  Administered 2016-06-18: 3.5 mg via EPIDURAL

## 2016-06-18 MED ORDER — ACETAMINOPHEN 325 MG PO TABS: 650.0000 mg | ORAL_TABLET | ORAL | Status: DC | PRN

## 2016-06-18 MED ORDER — FENTANYL CITRATE (PF) 100 MCG/2ML IJ SOLN: 50.0000 ug | INTRAMUSCULAR | Status: DC | PRN

## 2016-06-18 MED ORDER — PHENYLEPHRINE 40 MCG/ML (10ML) SYRINGE FOR IV PUSH (FOR BLOOD PRESSURE SUPPORT): 80.0000 ug | PREFILLED_SYRINGE | INTRAVENOUS | Status: DC | PRN

## 2016-06-18 MED ORDER — OXYTOCIN BOLUS FROM INFUSION: 500.0000 mL | Freq: Once | INTRAVENOUS | Status: DC

## 2016-06-18 MED ORDER — SCOPOLAMINE 1 MG/3DAYS TD PT72
MEDICATED_PATCH | TRANSDERMAL | Status: DC | PRN
  Administered 2016-06-18: 1 via TRANSDERMAL

## 2016-06-18 MED ORDER — LACTATED RINGERS IV SOLN
500.0000 mL | INTRAVENOUS | Status: DC | PRN
Start: 2016-06-18 — End: 2016-06-19

## 2016-06-18 MED ORDER — ONDANSETRON HCL 4 MG/2ML IJ SOLN: 4.0000 mg | Freq: Four times a day (QID) | INTRAMUSCULAR | Status: DC | PRN

## 2016-06-18 MED ORDER — LACTATED RINGERS IV SOLN: INTRAVENOUS | Status: DC

## 2016-06-18 MED ORDER — PHENYLEPHRINE 40 MCG/ML (10ML) SYRINGE FOR IV PUSH (FOR BLOOD PRESSURE SUPPORT)
PREFILLED_SYRINGE | INTRAVENOUS | Status: AC
  Filled 2016-06-18: qty 10

## 2016-06-18 MED ORDER — ONDANSETRON HCL 4 MG/2ML IJ SOLN
INTRAMUSCULAR | Status: AC
  Filled 2016-06-18: qty 4

## 2016-06-18 MED ORDER — EPHEDRINE 5 MG/ML INJ
10.0000 mg | INTRAVENOUS | Status: DC | PRN
Start: 2016-06-18 — End: 2016-06-19

## 2016-06-18 MED ORDER — CEFAZOLIN SODIUM-DEXTROSE 2-3 GM-% IV SOLR
INTRAVENOUS | Status: DC | PRN
  Administered 2016-06-18: 2 g via INTRAVENOUS

## 2016-06-18 MED ORDER — DEXAMETHASONE SODIUM PHOSPHATE 4 MG/ML IJ SOLN
INTRAMUSCULAR | Status: DC | PRN
  Administered 2016-06-18: 4 mg via INTRAVENOUS

## 2016-06-18 MED ORDER — DEXAMETHASONE SODIUM PHOSPHATE 4 MG/ML IJ SOLN
INTRAMUSCULAR | Status: AC
  Filled 2016-06-18: qty 1

## 2016-06-18 MED ORDER — TERBUTALINE SULFATE 1 MG/ML IJ SOLN: 0.2500 mg | Freq: Once | INTRAMUSCULAR | Status: DC | PRN

## 2016-06-18 MED ORDER — OXYTOCIN 40 UNITS IN LACTATED RINGERS INFUSION - SIMPLE MED: 2.5000 [IU]/h | INTRAVENOUS | Status: DC

## 2016-06-18 MED ORDER — PHENYLEPHRINE 40 MCG/ML (10ML) SYRINGE FOR IV PUSH (FOR BLOOD PRESSURE SUPPORT)
80.0000 ug | PREFILLED_SYRINGE | INTRAVENOUS | Status: DC | PRN
  Filled 2016-06-18: qty 10

## 2016-06-18 MED ORDER — LIDOCAINE HCL (PF) 1 % IJ SOLN
INTRAMUSCULAR | Status: DC | PRN
  Administered 2016-06-18 (×2): 4 mL via EPIDURAL

## 2016-06-18 MED ORDER — SOD CITRATE-CITRIC ACID 500-334 MG/5ML PO SOLN
30.0000 mL | ORAL | Status: DC | PRN
Start: 2016-06-18 — End: 2016-06-19
  Administered 2016-06-18: 30 mL via ORAL
  Filled 2016-06-18: qty 15

## 2016-06-18 MED ORDER — FENTANYL 2.5 MCG/ML BUPIVACAINE 1/10 % EPIDURAL INFUSION (WH - ANES)
14.0000 mL/h | INTRAMUSCULAR | Status: DC | PRN
  Administered 2016-06-18 (×2): 14 mL/h via EPIDURAL
  Filled 2016-06-18: qty 125

## 2016-06-18 MED ORDER — EPHEDRINE 5 MG/ML INJ: 10.0000 mg | INTRAVENOUS | Status: DC | PRN

## 2016-06-18 MED ORDER — LIDOCAINE HCL (PF) 1 % IJ SOLN: 30.0000 mL | INTRAMUSCULAR | Status: DC | PRN

## 2016-06-18 MED ORDER — OXYCODONE-ACETAMINOPHEN 5-325 MG PO TABS: 1.0000 | ORAL_TABLET | ORAL | Status: DC | PRN

## 2016-06-18 MED ORDER — OXYTOCIN 10 UNIT/ML IJ SOLN
INTRAMUSCULAR | Status: AC
  Filled 2016-06-18: qty 4

## 2016-06-18 MED ORDER — CEFAZOLIN SODIUM-DEXTROSE 2-4 GM/100ML-% IV SOLN
INTRAVENOUS | Status: AC
  Filled 2016-06-18: qty 100

## 2016-06-18 MED ORDER — SODIUM BICARBONATE 8.4 % IV SOLN
INTRAVENOUS | Status: DC | PRN
  Administered 2016-06-18: 5 mL via EPIDURAL
  Administered 2016-06-18: 3 mL via EPIDURAL
  Administered 2016-06-18 (×2): 5 mL via EPIDURAL
  Administered 2016-06-18: 2 mL via EPIDURAL

## 2016-06-18 MED ORDER — LACTATED RINGERS IV SOLN: 500.0000 mL | INTRAVENOUS | Status: DC | PRN

## 2016-06-18 MED ORDER — OXYTOCIN 40 UNITS IN LACTATED RINGERS INFUSION - SIMPLE MED
1.0000 m[IU]/min | INTRAVENOUS | Status: DC
  Administered 2016-06-18: 2 m[IU]/min via INTRAVENOUS
  Filled 2016-06-18: qty 1000

## 2016-06-18 MED ORDER — SCOPOLAMINE 1 MG/3DAYS TD PT72
MEDICATED_PATCH | TRANSDERMAL | Status: AC
  Filled 2016-06-18: qty 1

## 2016-06-18 MED ORDER — OXYTOCIN 10 UNIT/ML IJ SOLN
INTRAVENOUS | Status: DC | PRN
  Administered 2016-06-18: 40 [IU] via INTRAVENOUS

## 2016-06-18 MED ORDER — LIDOCAINE-EPINEPHRINE (PF) 2 %-1:200000 IJ SOLN
INTRAMUSCULAR | Status: AC
  Filled 2016-06-18: qty 20

## 2016-06-18 MED ORDER — ONDANSETRON HCL 4 MG/2ML IJ SOLN
4.0000 mg | Freq: Four times a day (QID) | INTRAMUSCULAR | Status: DC | PRN
  Administered 2016-06-18: 4 mg via INTRAVENOUS

## 2016-06-18 MED ORDER — SOD CITRATE-CITRIC ACID 500-334 MG/5ML PO SOLN: 30.0000 mL | ORAL | Status: DC | PRN

## 2016-06-18 MED ORDER — MORPHINE SULFATE (PF) 0.5 MG/ML IJ SOLN
INTRAMUSCULAR | Status: AC
  Filled 2016-06-18: qty 10

## 2016-06-18 MED ORDER — DIPHENHYDRAMINE HCL 50 MG/ML IJ SOLN: 12.5000 mg | INTRAMUSCULAR | Status: DC | PRN

## 2016-06-18 MED ORDER — PHENYLEPHRINE HCL 10 MG/ML IJ SOLN
INTRAMUSCULAR | Status: DC | PRN
  Administered 2016-06-18 (×3): 80 ug via INTRAVENOUS
  Administered 2016-06-19: 40 ug via INTRAVENOUS

## 2016-06-18 MED ORDER — LACTATED RINGERS IV SOLN
500.0000 mL | Freq: Once | INTRAVENOUS | Status: AC
  Administered 2016-06-18: 500 mL via INTRAVENOUS

## 2016-06-18 SURGICAL SUPPLY — 34 items
BENZOIN TINCTURE PRP APPL 2/3 (GAUZE/BANDAGES/DRESSINGS) ×2 IMPLANT
CHLORAPREP W/TINT 26ML (MISCELLANEOUS) ×2 IMPLANT
CLAMP CORD UMBIL (MISCELLANEOUS) IMPLANT
CLOSURE STERI STRIP 1/2 X4 (GAUZE/BANDAGES/DRESSINGS) ×2 IMPLANT
CLOTH BEACON ORANGE TIMEOUT ST (SAFETY) ×2 IMPLANT
CONTAINER PREFILL 10% NBF 15ML (MISCELLANEOUS) IMPLANT
DRSG OPSITE POSTOP 4X10 (GAUZE/BANDAGES/DRESSINGS) ×2 IMPLANT
ELECT REM PT RETURN 9FT ADLT (ELECTROSURGICAL) ×2
ELECTRODE REM PT RTRN 9FT ADLT (ELECTROSURGICAL) ×1 IMPLANT
EXTRACTOR VACUUM M CUP 4 TUBE (SUCTIONS) IMPLANT
GLOVE BIO SURGEON STRL SZ7.5 (GLOVE) ×2 IMPLANT
GLOVE BIOGEL PI IND STRL 7.0 (GLOVE) ×1 IMPLANT
GLOVE BIOGEL PI IND STRL 7.5 (GLOVE) ×1 IMPLANT
GLOVE BIOGEL PI INDICATOR 7.0 (GLOVE) ×1
GLOVE BIOGEL PI INDICATOR 7.5 (GLOVE) ×1
GOWN STRL REUS W/TWL LRG LVL3 (GOWN DISPOSABLE) ×4 IMPLANT
KIT ABG SYR 3ML LUER SLIP (SYRINGE) IMPLANT
NEEDLE HYPO 25X5/8 SAFETYGLIDE (NEEDLE) IMPLANT
NS IRRIG 1000ML POUR BTL (IV SOLUTION) ×2 IMPLANT
PACK C SECTION WH (CUSTOM PROCEDURE TRAY) ×2 IMPLANT
PAD OB MATERNITY 4.3X12.25 (PERSONAL CARE ITEMS) ×2 IMPLANT
PENCIL SMOKE EVAC W/HOLSTER (ELECTROSURGICAL) ×2 IMPLANT
RTRCTR C-SECT PINK 25CM LRG (MISCELLANEOUS) ×2 IMPLANT
STRIP CLOSURE SKIN 1/2X4 (GAUZE/BANDAGES/DRESSINGS) ×2 IMPLANT
SUT CHROMIC 2 0 CT 1 (SUTURE) ×2 IMPLANT
SUT MNCRL AB 3-0 PS2 27 (SUTURE) ×2 IMPLANT
SUT PLAIN 2 0 XLH (SUTURE) ×2 IMPLANT
SUT VIC AB 0 CT1 36 (SUTURE) ×2 IMPLANT
SUT VIC AB 0 CTX 36 (SUTURE) ×3
SUT VIC AB 0 CTX36XBRD ANBCTRL (SUTURE) ×3 IMPLANT
SUT VIC AB 2-0 SH 27 (SUTURE) ×2
SUT VIC AB 2-0 SH 27XBRD (SUTURE) ×2 IMPLANT
TOWEL OR 17X24 6PK STRL BLUE (TOWEL DISPOSABLE) ×2 IMPLANT
TRAY FOLEY CATH SILVER 14FR (SET/KITS/TRAYS/PACK) ×2 IMPLANT

## 2016-06-18 NOTE — H&P (Signed)
Jessica Hayden is a 35 y.o. female, G3P1 presenting at 40 weeks  for elevated blood pressure during office visit today. Reports on/off headaches in the last week but denies epigastric pain or visual symptoms. She reports good fetal movements and denies LOF but has some spotting since exam in the office.  Pregnancy followed at [facility] since 8+6  weeks and remarkable for:  1. Anxiety 2. 1st trimester spotting 3. Cervical polyp  OB History    Gravida Para Term Preterm AB Living   3 1 1   1 1    SAB TAB Ectopic Multiple Live Births   1             Past Medical History:  Diagnosis Date  . Depression   . Headache   . Medical history non-contributory    Past Surgical History:  Procedure Laterality Date  . APPENDECTOMY    . WISDOM TOOTH EXTRACTION      Family History:  1. Maternal grandmother: breast cancer, diabetes 2. Maternal uncle: colon cancer Social History:    reports that she has never smoked. She has never used smokeless tobacco. She reports that she does not drink alcohol or use drugs.   Prenatal labs: ABO, Rh:  A+ Antibody:  negative Rubella: immune RPR:   negative HBsAg:   negative HIV:   negative GBS:   negative   Prenatal Transfer Tool  Maternal Diabetes: No Genetic Screening: Normal Maternal Ultrasounds/Referrals: Normal Fetal Ultrasounds or other Referrals:  None Maternal Substance Abuse:  No Significant Maternal Medications:  None Significant Maternal Lab Results: None   Dilation: 2-3 Effacement (%): 50 Vertex -2 station Very posterior Blood pressure (!) 149/102, pulse 100, resp. rate 16, last menstrual period 04/25/2015.  General Appearance: Alert, appropriate appearance for age. No acute distress HEENT Exam: Grossly normal Chest/Respiratory Exam: Normal chest wall and respirations. Clear to auscultation  Cardiovascular Exam: Regular rate and rhythm. S1, S2, no murmur Gastrointestinal Exam: soft, non-tender, Uterus gravid with size  compatible with GA, Vertex presentation by Leopold's maneuvers Psychiatric Exam: Alert and oriented, appropriate affect Extremities:1+ edema normal DTR no clonus  ++++++++++++++++++++++++++++++++++++++++++++++++++++++++++++++++  Fetal tracings: Category 1  ++++++++++++++++++++++++++++++++++++++++++++++++++++++++++++++++   Assessment/Plan:  SIUP at 40 weeks with new onset of hypertension  PIH labs normal but PCR pending Admit for induction of labor SVD expected   Silverio Lay MD 06/18/2016, 3:41 PM

## 2016-06-18 NOTE — Anesthesia Procedure Notes (Signed)
Epidural Patient location during procedure: OB Start time: 06/18/2016 9:02 PM  Staffing Anesthesiologist: Mal Amabile Performed: anesthesiologist   Preanesthetic Checklist Completed: patient identified, site marked, surgical consent, pre-op evaluation, timeout performed, IV checked, risks and benefits discussed and monitors and equipment checked  Epidural Patient position: sitting Prep: site prepped and draped and DuraPrep Patient monitoring: continuous pulse ox and blood pressure Approach: midline Location: L3-L4 Injection technique: LOR air  Needle:  Needle type: Tuohy  Needle gauge: 17 G Needle length: 9 cm and 9 Needle insertion depth: 5 cm cm Catheter type: closed end flexible Catheter size: 19 Gauge Catheter at skin depth: 11 cm Test dose: negative and Other  Assessment Events: blood not aspirated, injection not painful, no injection resistance, negative IV test and no paresthesia  Additional Notes Patient identified. Risks and benefits discussed including failed block, incomplete  Pain control, post dural puncture headache, nerve damage, paralysis, blood pressure Changes, nausea, vomiting, reactions to medications-both toxic and allergic and post Partum back pain. All questions were answered. Patient expressed understanding and wished to proceed. Sterile technique was used throughout procedure. Epidural site was Dressed with sterile barrier dressing. No paresthesias, signs of intravascular injection Or signs of intrathecal spread were encountered.  Patient was more comfortable after the epidural was dosed. Please see RN's note for documentation of vital signs and FHR which are stable.

## 2016-06-18 NOTE — Progress Notes (Signed)
Jessica Hayden is a 35 y.o. G3P1011 at [redacted]w[redacted]d admitted for induction of labor due to [facility].  Subjective: Pt feeling contractions but not c/o pain too much at this point.  She is going to want an epidural.  Objective: BP (!) 147/91   Pulse 86   Temp 98.3 F (36.8 C) (Oral)   Resp 16   Ht 5\' 7"  (1.702 m)   Wt 186 lb (84.4 kg)   LMP 04/25/2015   SpO2 100%   BMI 29.13 kg/m  No intake/output data recorded. No intake/output data recorded.  FHT:  FHR: 140s bpm, variability: minimal - moderate,  accelerations:  Present,  decelerations:  Absent UC:   regular, every 3-4 minutes SVE:   Dilation: 2.5 Effacement (%): 50 Station: -2 Exam by:: Dr.Tyniya Kuyper  Labs: Lab Results  Component Value Date   WBC 12.4 (H) 06/18/2016   HGB 11.6 (L) 06/18/2016   HCT 32.8 (L) 06/18/2016   MCV 89.4 06/18/2016   PLT 223 06/18/2016    Assessment / Plan: Induction of labor due to GHTN started on pitocin.    Labor: Progressing on Pitocin, will continue to increase then AROM Preeclampsia:  no signs or symptoms of toxicity Fetal Wellbeing:  Category I Pain Control:  Will get epidural and then AROM I/D:  GBS neg Anticipated MOD:  NSVD  Kendel Bessey Y 06/18/2016, 9:14 PM

## 2016-06-18 NOTE — Anesthesia Pain Management Evaluation Note (Signed)
  CRNA Pain Management Visit Note  Patient: Jessica Hayden, 35 y.o., female  "Hello I am a member of the anesthesia team at [facility]. We have an anesthesia team available at all times to provide care throughout the hospital, including epidural management and anesthesia for C-section. I don't know your plan for the delivery whether it a natural birth, water birth, IV sedation, nitrous supplementation, doula or epidural, but we want to meet your pain goals."   1.Was your pain managed to your expectations on prior hospitalizations?   Yes   2.What is your expectation for pain management during this hospitalization?     Epidural  3.How can we help you reach that goal?   Record the patient's initial score and the patient's pain goal.   Pain: 3  Pain Goal: 6 [facility] wants you to be able to say your pain was always managed very well.  Cephus Shelling 06/18/2016

## 2016-06-18 NOTE — Anesthesia Preprocedure Evaluation (Addendum)
Anesthesia Evaluation  Patient identified by MRN, date of birth, ID band Patient awake    Reviewed: Allergy & Precautions, Patient's Chart, lab work & pertinent test results  Airway Mallampati: II  TM Distance: >3 FB Neck ROM: Full    Dental no notable dental hx. (+) Teeth Intact   Pulmonary neg pulmonary ROS,    Pulmonary exam normal breath sounds clear to auscultation       Cardiovascular negative cardio ROS Normal cardiovascular exam Rhythm:Regular Rate:Normal     Neuro/Psych  Headaches, PSYCHIATRIC DISORDERS Depression    GI/Hepatic negative GI ROS, Neg liver ROS,   Endo/Other  negative endocrine ROS  Renal/GU negative Renal ROS  negative genitourinary   Musculoskeletal negative musculoskeletal ROS (+)   Abdominal   Peds  Hematology  (+) anemia ,   Anesthesia Other Findings   Reproductive/Obstetrics (+) Pregnancy                             Anesthesia Physical Anesthesia Plan  ASA: II and emergent  Anesthesia Plan: Epidural   Post-op Pain Management:    Induction:   Airway Management Planned: Natural Airway  Additional Equipment:   Intra-op Plan:   Post-operative Plan:   Informed Consent: I have reviewed the patients History and Physical, chart, labs and discussed the procedure including the risks, benefits and alternatives for the proposed anesthesia with the patient or authorized representative who has indicated his/her understanding and acceptance.     Plan Discussed with: Anesthesiologist, CRNA and Surgeon  Anesthesia Plan Comments: (Breech presentation for urgent C/Section. Will use epidural for C/Section. )       Anesthesia Quick Evaluation

## 2016-06-18 NOTE — MAU Provider Note (Signed)
History     CSN: [id number]  Arrival date and time: 06/18/16 1353   First Provider Initiated Contact with Patient 06/18/16 1457      Chief Complaint  Patient presents with  . Hypertension   HPI   Ms.Jessica Hayden is a 35 y.o. female G3P1011 @ [redacted]w[redacted]d here with elevated BP readings. She was seen in the office today and had BP of 147/102. She was told she would likely stay to have her baby.    + fetal movement  Denies vaginal bleeding   OB History    Gravida Para Term Preterm AB Living   3 1 1   1 1    SAB TAB Ectopic Multiple Live Births   1              Past Medical History:  Diagnosis Date  . Depression   . Headache   . Medical history non-contributory     Past Surgical History:  Procedure Laterality Date  . APPENDECTOMY    . WISDOM TOOTH EXTRACTION      Family History  Problem Relation Age of Onset  . Alcohol abuse Neg Hx   . Arthritis Neg Hx   . Asthma Neg Hx   . Birth defects Neg Hx   . Cancer Neg Hx   . COPD Neg Hx   . Depression Neg Hx   . Diabetes Neg Hx   . Drug abuse Neg Hx   . Early death Neg Hx   . Hearing loss Neg Hx   . Heart disease Neg Hx   . Hyperlipidemia Neg Hx   . Hypertension Neg Hx   . Kidney disease Neg Hx   . Learning disabilities Neg Hx   . Mental illness Neg Hx   . Mental retardation Neg Hx   . Miscarriages / Stillbirths Neg Hx   . Stroke Neg Hx   . Vision loss Neg Hx   . Varicose Veins Neg Hx     Social History  Substance Use Topics  . Smoking status: Never Smoker  . Smokeless tobacco: Never Used  . Alcohol use No    Allergies: No Known Allergies  Prescriptions Prior to Admission  Medication Sig Dispense Refill Last Dose  . acetaminophen (TYLENOL) 325 MG tablet Take 650 mg by mouth every 6 (six) hours as needed for mild pain or headache.    Past Month at Unknown time  . sertraline (ZOLOFT) 50 MG tablet Take 50 mg by mouth daily.   12/30/2015 at Unknown time   Results for orders placed or performed during the  hospital encounter of 06/18/16 (from the past 48 hour(s))  Protein / creatinine ratio, urine     Status: None (Preliminary result)   Collection Time: 06/18/16  2:08 PM  Result Value Ref Range   Creatinine, Urine 27.00 mg/dL   Total Protein, Urine PENDING mg/dL   Protein Creatinine Ratio PENDING 0.00 - 0.15 mg/mg[Cre]    Review of Systems  Gastrointestinal: Negative for abdominal pain.  Neurological: Negative for headaches.   Physical Exam   Blood pressure (!) 149/102, pulse 90, last menstrual period 04/25/2015.   Patient Vitals for the past 24 hrs:  BP Pulse Resp  06/18/16 1445 (!) 149/102 100 16  06/18/16 1444 (!) 149/102 90 -  06/18/16 1430 154/94 99 16    Physical Exam  Constitutional: She is oriented to person, place, and time. She appears well-developed and well-nourished. No distress.  GI: Soft. She exhibits no distension. There is no tenderness.  There is no rebound and no guarding.  Musculoskeletal: Normal range of motion.  Neurological: She is alert and oriented to person, place, and time. She displays normal reflexes.  Negative clonus   Skin: Skin is warm. She is not diaphoretic.  Psychiatric: Her behavior is normal.    Fetal Tracing: Baseline: 130 bpm  Variability: Moderate  Accelerations: 15x15 Decelerations: None Toco: 2-4 irregular pattern   MAU Course  Procedures  None  MDM  Discussed BP readings with Dr. Estanislado Pandy, including office BP Labs pending   Assessment and Plan    A:  Pregnancy induced hypertension GBS negative  Category 1 fetal tracing   P:  Admit to labor and delivery per Dr. Estanislado Pandy.  Preeclampsia labs pending    Duane Lope, NP 06/18/2016 2:58 PM

## 2016-06-18 NOTE — MAU Note (Signed)
Pt went for scheduled OB appointment today, Bp was elevated today for first time.  Has had a couple of HA's over the past week, none today, denies visual changes.  Had some epigastric pain earlier today, none now.  Has irregular uc's, has some spotting from cervical exam in office - 3 cm's, denies LOF.

## 2016-06-19 ENCOUNTER — Encounter (HOSPITAL_COMMUNITY): Payer: Self-pay | Admitting: Obstetrics and Gynecology

## 2016-06-19 DIAGNOSIS — Z98891 History of uterine scar from previous surgery: Secondary | ICD-10-CM

## 2016-06-19 LAB — CBC
HCT: 28.6 % — ABNORMAL LOW (ref 36.0–46.0)
HEMOGLOBIN: 9.8 g/dL — AB (ref 12.0–15.0)
MCH: 30.5 pg (ref 26.0–34.0)
MCHC: 34.3 g/dL (ref 30.0–36.0)
MCV: 89.1 fL (ref 78.0–100.0)
Platelets: 233 10*3/uL (ref 150–400)
RBC: 3.21 MIL/uL — AB (ref 3.87–5.11)
RDW: 14.1 % (ref 11.5–15.5)
WBC: 17 10*3/uL — ABNORMAL HIGH (ref 4.0–10.5)

## 2016-06-19 LAB — COMPREHENSIVE METABOLIC PANEL
ALBUMIN: 2.4 g/dL — AB (ref 3.5–5.0)
ALK PHOS: 118 U/L (ref 38–126)
ALT: 23 U/L (ref 14–54)
AST: 32 U/L (ref 15–41)
Anion gap: 6 (ref 5–15)
BUN: 5 mg/dL — ABNORMAL LOW (ref 6–20)
CO2: 23 mmol/L (ref 22–32)
Calcium: 8.2 mg/dL — ABNORMAL LOW (ref 8.9–10.3)
Chloride: 106 mmol/L (ref 101–111)
Creatinine, Ser: 0.65 mg/dL (ref 0.44–1.00)
GFR calc non Af Amer: >60 mL/min (ref >60)
Glucose, Bld: 106 mg/dL — ABNORMAL HIGH (ref 65–99)
Potassium: 3.2 mmol/L — ABNORMAL LOW (ref 3.5–5.1)
SODIUM: 135 mmol/L (ref 135–145)
TOTAL PROTEIN: 5.4 g/dL — AB (ref 6.5–8.1)
Total Bilirubin: 0.7 mg/dL (ref 0.3–1.2)

## 2016-06-19 LAB — BIRTH TISSUE RECOVERY COLLECTION (PLACENTA DONATION)

## 2016-06-19 LAB — RPR: RPR Ser Ql: NONREACTIVE

## 2016-06-19 LAB — ABO/RH: ABO/RH(D): A POS

## 2016-06-19 MED ORDER — ACETAMINOPHEN 500 MG PO TABS
1000.0000 mg | ORAL_TABLET | Freq: Four times a day (QID) | ORAL | Status: AC
  Administered 2016-06-19: 1000 mg via ORAL
  Filled 2016-06-19 (×2): qty 2

## 2016-06-19 MED ORDER — DIPHENHYDRAMINE HCL 25 MG PO CAPS: 25.0000 mg | ORAL_CAPSULE | ORAL | Status: DC | PRN

## 2016-06-19 MED ORDER — SIMETHICONE 80 MG PO CHEW
80.0000 mg | CHEWABLE_TABLET | ORAL | Status: DC
  Administered 2016-06-19 – 2016-06-20 (×2): 80 mg via ORAL
  Filled 2016-06-19 (×2): qty 1

## 2016-06-19 MED ORDER — DIPHENHYDRAMINE HCL 50 MG/ML IJ SOLN: 12.5000 mg | INTRAMUSCULAR | Status: DC | PRN

## 2016-06-19 MED ORDER — KETOROLAC TROMETHAMINE 30 MG/ML IJ SOLN
30.0000 mg | Freq: Four times a day (QID) | INTRAMUSCULAR | Status: AC | PRN
  Administered 2016-06-19: 30 mg via INTRAMUSCULAR

## 2016-06-19 MED ORDER — MEPERIDINE HCL 25 MG/ML IJ SOLN
INTRAMUSCULAR | Status: DC | PRN
  Administered 2016-06-19 (×2): 12.5 mg via INTRAVENOUS

## 2016-06-19 MED ORDER — NALBUPHINE HCL 10 MG/ML IJ SOLN: 5.0000 mg | INTRAMUSCULAR | Status: DC | PRN

## 2016-06-19 MED ORDER — NALOXONE HCL 2 MG/2ML IJ SOSY
1.0000 ug/kg/h | PREFILLED_SYRINGE | INTRAVENOUS | Status: DC | PRN
  Filled 2016-06-19: qty 2

## 2016-06-19 MED ORDER — NALBUPHINE HCL 10 MG/ML IJ SOLN: 5.0000 mg | Freq: Once | INTRAMUSCULAR | Status: DC | PRN

## 2016-06-19 MED ORDER — NALOXONE HCL 0.4 MG/ML IJ SOLN: 0.4000 mg | INTRAMUSCULAR | Status: DC | PRN

## 2016-06-19 MED ORDER — LACTATED RINGERS IV SOLN
INTRAVENOUS | Status: DC
  Administered 2016-06-19: 09:00:00 via INTRAVENOUS

## 2016-06-19 MED ORDER — KETOROLAC TROMETHAMINE 30 MG/ML IJ SOLN
INTRAMUSCULAR | Status: AC
  Administered 2016-06-19: 30 mg via INTRAMUSCULAR
  Filled 2016-06-19: qty 1

## 2016-06-19 MED ORDER — MEPERIDINE HCL 25 MG/ML IJ SOLN: 6.2500 mg | INTRAMUSCULAR | Status: DC | PRN

## 2016-06-19 MED ORDER — ACETAMINOPHEN 325 MG PO TABS: 650.0000 mg | ORAL_TABLET | ORAL | Status: DC | PRN

## 2016-06-19 MED ORDER — SIMETHICONE 80 MG PO CHEW: 80.0000 mg | CHEWABLE_TABLET | ORAL | Status: DC | PRN

## 2016-06-19 MED ORDER — DIPHENHYDRAMINE HCL 25 MG PO CAPS: 25.0000 mg | ORAL_CAPSULE | Freq: Four times a day (QID) | ORAL | Status: DC | PRN

## 2016-06-19 MED ORDER — SODIUM CHLORIDE 0.9% FLUSH: 3.0000 mL | INTRAVENOUS | Status: DC | PRN

## 2016-06-19 MED ORDER — TETANUS-DIPHTH-ACELL PERTUSSIS 5-2.5-18.5 LF-MCG/0.5 IM SUSP: 0.5000 mL | Freq: Once | INTRAMUSCULAR | Status: DC

## 2016-06-19 MED ORDER — DIBUCAINE 1 % RE OINT: 1.0000 "application " | TOPICAL_OINTMENT | RECTAL | Status: DC | PRN

## 2016-06-19 MED ORDER — OXYCODONE-ACETAMINOPHEN 5-325 MG PO TABS
1.0000 | ORAL_TABLET | ORAL | Status: DC | PRN
  Administered 2016-06-19 – 2016-06-21 (×4): 1 via ORAL
  Filled 2016-06-19 (×4): qty 1

## 2016-06-19 MED ORDER — SIMETHICONE 80 MG PO CHEW
80.0000 mg | CHEWABLE_TABLET | Freq: Three times a day (TID) | ORAL | Status: DC
  Administered 2016-06-19 – 2016-06-21 (×6): 80 mg via ORAL
  Filled 2016-06-19 (×5): qty 1

## 2016-06-19 MED ORDER — PRENATAL MULTIVITAMIN CH
1.0000 | ORAL_TABLET | Freq: Every day | ORAL | Status: DC
  Administered 2016-06-19 – 2016-06-21 (×3): 1 via ORAL
  Filled 2016-06-19 (×3): qty 1

## 2016-06-19 MED ORDER — ONDANSETRON HCL 4 MG/2ML IJ SOLN
4.0000 mg | Freq: Three times a day (TID) | INTRAMUSCULAR | Status: DC | PRN
  Administered 2016-06-19: 4 mg via INTRAVENOUS
  Filled 2016-06-19: qty 2

## 2016-06-19 MED ORDER — COCONUT OIL OIL
1.0000 | TOPICAL_OIL | Status: DC | PRN
Start: 2016-06-19 — End: 2016-06-21

## 2016-06-19 MED ORDER — MENTHOL 3 MG MT LOZG: 1.0000 | LOZENGE | OROMUCOSAL | Status: DC | PRN

## 2016-06-19 MED ORDER — OXYCODONE-ACETAMINOPHEN 5-325 MG PO TABS
2.0000 | ORAL_TABLET | ORAL | Status: DC | PRN
  Administered 2016-06-20: 2 via ORAL
  Filled 2016-06-19: qty 2

## 2016-06-19 MED ORDER — SENNOSIDES-DOCUSATE SODIUM 8.6-50 MG PO TABS
2.0000 | ORAL_TABLET | ORAL | Status: DC
  Administered 2016-06-19 – 2016-06-20 (×2): 2 via ORAL
  Filled 2016-06-19 (×2): qty 2

## 2016-06-19 MED ORDER — ZOLPIDEM TARTRATE 5 MG PO TABS: 5.0000 mg | ORAL_TABLET | Freq: Every evening | ORAL | Status: DC | PRN

## 2016-06-19 MED ORDER — IBUPROFEN 600 MG PO TABS: 600.0000 mg | ORAL_TABLET | Freq: Four times a day (QID) | ORAL | Status: DC | PRN

## 2016-06-19 MED ORDER — WITCH HAZEL-GLYCERIN EX PADS: 1.0000 "application " | MEDICATED_PAD | CUTANEOUS | Status: DC | PRN

## 2016-06-19 MED ORDER — FENTANYL CITRATE (PF) 100 MCG/2ML IJ SOLN: 25.0000 ug | INTRAMUSCULAR | Status: DC | PRN

## 2016-06-19 MED ORDER — IBUPROFEN 600 MG PO TABS
600.0000 mg | ORAL_TABLET | Freq: Four times a day (QID) | ORAL | Status: DC
  Administered 2016-06-19 – 2016-06-21 (×9): 600 mg via ORAL
  Filled 2016-06-19 (×10): qty 1

## 2016-06-19 MED ORDER — OXYTOCIN 40 UNITS IN LACTATED RINGERS INFUSION - SIMPLE MED: 2.5000 [IU]/h | INTRAVENOUS | Status: AC

## 2016-06-19 MED ORDER — MEPERIDINE HCL 25 MG/ML IJ SOLN
INTRAMUSCULAR | Status: AC
  Filled 2016-06-19: qty 1

## 2016-06-19 MED ORDER — KETOROLAC TROMETHAMINE 30 MG/ML IJ SOLN
30.0000 mg | Freq: Four times a day (QID) | INTRAMUSCULAR | Status: AC | PRN
  Administered 2016-06-19: 30 mg via INTRAVENOUS
  Filled 2016-06-19: qty 1

## 2016-06-19 NOTE — Op Note (Addendum)
Cesarean Section Procedure Note  Indications: P1 at 40wks admitted for induction secondary to GDM.  I took over the patient when she was already on pitocin.  After epidural, I AROM pt and did not feel cephalic presentation.  Bedside ultrasound was performed and baby was found to be breech with head in LUQ.  Cat 1 tracing throughout.  I reviewed findings with patient and her husband and recommended proceeding with c-section.  I discussed R/B/A including but not limited to bleeding, infection and injury and they wanted to proceed with c-section.  Pre-operative Diagnosis: primary cesarean section for breech presentation   Post-operative Diagnosis: primary cesarean section for breech presentation  Procedure: PRIMARY LOW TRANSVERSE CESAREAN SECTION  Surgeon: Osborn Coho, MD    Assistants: Surgical Tech, Brooke  Anesthesia: Regional  Anesthesiologist: Mal Amabile, MD   Procedure Details  The patient was taken to the operating room secondary to malpresentation after the risks, benefits, complications, treatment options, and expected outcomes were discussed with the patient.  The patient concurred with the proposed plan, giving informed consent which was signed and witnessed. The patient was taken to Operating Room One, identified as Jessica Hayden and the procedure verified as C-Section Delivery. A Time Out was held and the above information confirmed.  After induction of anesthesia by obtaining a spinal, the patient was prepped and draped in the usual sterile manner. A Pfannenstiel skin incision was made and carried down through the subcutaneous tissue to the underlying layer of fascia.  The fascia was incised bilaterally and extended transversely bilaterally with the Mayo scissors. Kocher clamps were placed on the inferior aspect of the fascial incision and the underlying rectus muscle was separated from the fascia. The same was done on the superior aspect of the fascial incision.  The  peritoneum was identified, entered bluntly and extended manually.  An Alexis self-retaining retractor was placed.  The utero-vesical peritoneal reflection was incised transversely and the bladder flap was bluntly freed from the lower uterine segment. A low transverse uterine incision was made with the scalpel and extended bilaterally with the bandage scissors.  The infant was delivered via breech extraction without difficulty.  After the umbilical cord was clamped and cut, the infant was handed to the awaiting pediatricians.  Cord blood was obtained for evaluation.  The placenta was removed intact and appeared to be within normal limits. The uterus was cleared of all clots and debris. The uterine incision was closed with running interlocking sutures of 0 Vicryl and a second imbricating layer was performed as well.   Bilateral tubes and ovaries appeared to be within normal limits.  Good hemostasis was noted.  Copious irrigation was performed until clear.  The peritoneum was repaired with 2-0 chromic via a running suture.  The fascia was reapproximated with a running suture of 0 Vicryl. The subcutaneous tissue was reapproximated with 4 interrupted sutures of 2-0 plain.  The skin was reapproximated with a subcuticular suture of 3-0 monocryl.  Steristrips were applied.  Instrument, sponge, and needle counts were correct prior to abdominal closure and at the conclusion of the case.  The patient was awaiting transfer to the recovery room in good condition.  Findings: Live female infant with Apgars 8 at one minute and 9 at five minutes.  Normal appearing bilateral ovaries and fallopian tubes were noted.  Estimated Blood Loss:  900 ml         Drains: foley to gravity 150 cc         Total IV  Fluids: 2000 ml         Specimens to Pathology: none         Complications:  None; patient tolerated the procedure well.         Disposition: PACU - hemodynamically stable.         Condition: stable  Attending  Attestation: I performed the procedure.

## 2016-06-19 NOTE — Anesthesia Postprocedure Evaluation (Signed)
Anesthesia Post Note  Patient: Jessica Hayden  Procedure(s) Performed: Procedure(s) (LRB): CESAREAN SECTION (N/A)  Patient location during evaluation: PACU Anesthesia Type: Epidural Level of consciousness: awake and alert and oriented Pain management: pain level controlled Vital Signs Assessment: post-procedure vital signs reviewed and stable Respiratory status: spontaneous breathing, nonlabored ventilation and respiratory function stable Cardiovascular status: blood pressure returned to baseline and stable Postop Assessment: no signs of nausea or vomiting, no headache, no backache, epidural receding and patient able to bend at knees Anesthetic complications: no     Last Vitals:  Vitals:   06/19/16 0136 06/19/16 0137  BP:    Pulse: (!) 102 (!) 103  Resp: (!) 22 18  Temp:      Last Pain:  Vitals:   06/19/16 0115  TempSrc:   PainSc: 3    Pain Goal: Patients Stated Pain Goal: 0 (06/18/16 1422)               Robin Pafford A.

## 2016-06-19 NOTE — Progress Notes (Signed)
Subjective: Postpartum Day 1: Cesarean Delivery Patient reports tolerating PO.    Objective: Vital signs in last 24 hours: Temp:  [97.7 F (36.5 C)-99.1 F (37.3 C)] 98.2 F (36.8 C) (07/28 1300) Pulse Rate:  [83-124] 87 (07/28 1300) Resp:  [13-25] 19 (07/28 1300) BP: (107-155)/(80-126) 129/80 (07/28 1300) SpO2:  [97 %-100 %] 100 % (07/28 1300) Weight:  [186 lb (84.4 kg)] 186 lb (84.4 kg) (07/27 1812)  Physical Exam:  General: alert and cooperative Lochia: appropriate Uterine Fundus: firm Incision: healing well, no significant drainage DVT Evaluation: No evidence of DVT seen on physical exam. \CV RRR LUNGS CTA B Recent Results (from the past 2160 hour(s))  OB RESULT CONSOLE Group B Strep     Status: None   Collection Time: 05/22/16 12:00 AM  Result Value Ref Range   GBS Negative   Protein / creatinine ratio, urine     Status: None   Collection Time: 06/18/16  2:08 PM  Result Value Ref Range   Creatinine, Urine 27.00 mg/dL   Total Protein, Urine <6 mg/dL    Comment: REPEATED TO VERIFY NO NORMAL RANGE ESTABLISHED FOR THIS TEST    Protein Creatinine Ratio        0.00 - 0.15 mg/mg[Cre]    Comment: RESULT BELOW REPORTABLE RANGE, UNABLE TO CALCULATE.   Urinalysis, Routine w reflex microscopic (not at [facility])     Status: Abnormal   Collection Time: 06/18/16  2:08 PM  Result Value Ref Range   Color, Urine YELLOW YELLOW   APPearance CLEAR CLEAR   Specific Gravity, Urine <1.005 (L) 1.005 - 1.030   pH 6.0 5.0 - 8.0   Glucose, UA NEGATIVE NEGATIVE mg/dL   Hgb urine dipstick MODERATE (A) NEGATIVE   Bilirubin Urine NEGATIVE NEGATIVE   Ketones, ur NEGATIVE NEGATIVE mg/dL   Protein, ur NEGATIVE NEGATIVE mg/dL   Nitrite NEGATIVE NEGATIVE   Leukocytes, UA NEGATIVE NEGATIVE  Urine microscopic-add on     Status: Abnormal   Collection Time: 06/18/16  2:08 PM  Result Value Ref Range   Squamous Epithelial / LPF 6-30 (A) NONE SEEN   WBC, UA 6-30 0 - 5 WBC/hpf   RBC / HPF NONE SEEN 0  - 5 RBC/hpf   Bacteria, UA FEW (A) NONE SEEN  CBC     Status: Abnormal   Collection Time: 06/18/16  3:03 PM  Result Value Ref Range   WBC 11.8 (H) 4.0 - 10.5 K/uL   RBC 3.75 (L) 3.87 - 5.11 MIL/uL   Hemoglobin 11.7 (L) 12.0 - 15.0 g/dL   HCT 33.7 (L) 36.0 - 46.0 %   MCV 89.9 78.0 - 100.0 fL   MCH 31.2 26.0 - 34.0 pg   MCHC 34.7 30.0 - 36.0 g/dL   RDW 14.2 11.5 - 15.5 %   Platelets 211 150 - 400 K/uL  Comprehensive metabolic panel     Status: Abnormal   Collection Time: 06/18/16  3:03 PM  Result Value Ref Range   Sodium 138 135 - 145 mmol/L   Potassium 3.0 (L) 3.5 - 5.1 mmol/L   Chloride 107 101 - 111 mmol/L   CO2 22 22 - 32 mmol/L   Glucose, Bld 117 (H) 65 - 99 mg/dL   BUN <5 (L) 6 - 20 mg/dL    Comment: REPEATED TO VERIFY   Creatinine, Ser 0.58 0.44 - 1.00 mg/dL   Calcium 9.1 8.9 - 10.3 mg/dL   Total Protein 6.6 6.5 - 8.1 g/dL   Albumin 3.2 (L)  3.5 - 5.0 g/dL   AST 36 15 - 41 U/L   ALT 26 14 - 54 U/L   Alkaline Phosphatase 156 (H) 38 - 126 U/L   Total Bilirubin 0.8 0.3 - 1.2 mg/dL   GFR calc non Af Amer >60 >60 mL/min   GFR calc Af Amer >60 >60 mL/min    Comment: (NOTE) The eGFR has been calculated using the CKD EPI equation. This calculation has not been validated in all clinical situations. eGFR's persistently <60 mL/min signify possible Chronic Kidney Disease.    Anion gap 9 5 - 15  CBC     Status: Abnormal   Collection Time: 06/18/16  7:37 PM  Result Value Ref Range   WBC 12.4 (H) 4.0 - 10.5 K/uL   RBC 3.67 (L) 3.87 - 5.11 MIL/uL   Hemoglobin 11.6 (L) 12.0 - 15.0 g/dL   HCT 32.8 (L) 36.0 - 46.0 %   MCV 89.4 78.0 - 100.0 fL   MCH 31.6 26.0 - 34.0 pg   MCHC 35.4 30.0 - 36.0 g/dL   RDW 14.3 11.5 - 15.5 %   Platelets 223 150 - 400 K/uL  RPR     Status: None   Collection Time: 06/18/16  7:37 PM  Result Value Ref Range   RPR Ser Ql Non Reactive Non Reactive    Comment: (NOTE) Performed At: [facility] [place], [place]  [id number] Lindon Romp MD [id number]   Type and screen [place]     Status: None   Collection Time: 06/18/16  7:42 PM  Result Value Ref Range   ABO/RH(D) A POS    Antibody Screen NEG    Sample Expiration 06/21/2016   ABO/Rh     Status: None   Collection Time: 06/18/16  7:42 PM  Result Value Ref Range   ABO/RH(D) A POS   CBC     Status: Abnormal   Collection Time: 06/19/16  5:58 AM  Result Value Ref Range   WBC 17.0 (H) 4.0 - 10.5 K/uL   RBC 3.21 (L) 3.87 - 5.11 MIL/uL   Hemoglobin 9.8 (L) 12.0 - 15.0 g/dL   HCT 28.6 (L) 36.0 - 46.0 %   MCV 89.1 78.0 - 100.0 fL   MCH 30.5 26.0 - 34.0 pg   MCHC 34.3 30.0 - 36.0 g/dL   RDW 14.1 11.5 - 15.5 %   Platelets 233 150 - 400 K/uL  Comprehensive metabolic panel     Status: Abnormal   Collection Time: 06/19/16  5:58 AM  Result Value Ref Range   Sodium 135 135 - 145 mmol/L   Potassium 3.2 (L) 3.5 - 5.1 mmol/L   Chloride 106 101 - 111 mmol/L   CO2 23 22 - 32 mmol/L   Glucose, Bld 106 (H) 65 - 99 mg/dL   BUN 5 (L) 6 - 20 mg/dL   Creatinine, Ser 0.65 0.44 - 1.00 mg/dL   Calcium 8.2 (L) 8.9 - 10.3 mg/dL   Total Protein 5.4 (L) 6.5 - 8.1 g/dL   Albumin 2.4 (L) 3.5 - 5.0 g/dL   AST 32 15 - 41 U/L   ALT 23 14 - 54 U/L   Alkaline Phosphatase 118 38 - 126 U/L   Total Bilirubin 0.7 0.3 - 1.2 mg/dL   GFR calc non Af Amer >60 >60 mL/min   GFR calc Af Amer >60 >60 mL/min    Comment: (NOTE) The eGFR has been calculated using the CKD EPI equation.  This calculation has not been validated in all clinical situations. eGFR's persistently <60 mL/min signify possible Chronic Kidney Disease.    Anion gap 6 5 - 56  Collect bld for placenta donatation     Status: None   Collection Time: 06/19/16 12:08 PM  Result Value Ref Range   Placenta donation bld collect COLLECTED BY LABORATORY     Recent Labs  06/18/16 1937 06/19/16 0558  HGB 11.6* 9.8*  HCT 32.8* 28.6*    Assessment/Plan: Status post Cesarean section.  Doing well postoperatively.  BPS ELEVATED however pt asymptomatic.  Will monitor  Meeker A 06/19/2016, 2:05 PM

## 2016-06-19 NOTE — Lactation Note (Signed)
This note was copied from a baby's chart. Lactation Consultation Note Mom has 35 yr old that she BF for a few weeks and stopped d/t low milk supply. Mom has everted nipples, hand expressed w/colostrum noted. Mom had increase in beast size during pregnancy. Mom trying to latch in cradle position trying to get a wider flange. Assisted in football hold. Obtained a wider flange. Mom liked that position. Mom encouraged to feed baby 8-12 times/24 hours and with feeding cues. Mom shown how to use DEBP & how to disassemble, clean, & reassemble parts. Mom knows to pump q3h for 15-20 min.Referred to Baby and Me Book in Breastfeeding section Pg. 22-23 for position options and Proper latch demonstration.  Educated about newborn behavior, STS, I&O, cluster feeding. Mom states she will be doing breast and formula later more than likely. She doesn't know how long she will BF and then she will be totally formula. Discussed supply and demand.  Patient Name: Boy Yehudis Podraza [facility] brochure given w/resources, support groups and LC services. Today's Date: 06/19/2016 Reason for consult: Initial assessment   Maternal Data Has patient been taught Hand Expression?: Yes Does the patient have breastfeeding experience prior to this delivery?: Yes  Feeding Feeding Type: Breast Fed Length of feed: 15 min  LATCH Score/Interventions Latch: Repeated attempts needed to sustain latch, nipple held in mouth throughout feeding, stimulation needed to elicit sucking reflex. Intervention(s): Adjust position;Assist with latch;Breast massage;Breast compression  Audible Swallowing: None Intervention(s): Skin to skin;Hand expression  Type of Nipple: Everted at rest and after stimulation  Comfort (Breast/Nipple): Soft / non-tender     Hold (Positioning): Assistance needed to correctly position infant at breast and maintain latch. Intervention(s): Skin to skin;Position options;Support Pillows;Breastfeeding basics  reviewed  LATCH Score: 6  Lactation Tools Discussed/Used Tools: Pump Breast pump type: Double-Electric Breast Pump WIC Program: Yes Pump Review: Setup, frequency, and cleaning;Milk Storage Initiated by:: Peri Jefferson RN IBCLC Date initiated:: 06/19/16   Consult Status Consult Status: Follow-up Date: 06/20/16 Follow-up type: In-patient    Charyl Dancer 06/19/2016, 4:23 AM

## 2016-06-19 NOTE — Anesthesia Postprocedure Evaluation (Signed)
Anesthesia Post Note  Patient: Saylem Bargas  Procedure(s) Performed: Procedure(s) (LRB): CESAREAN SECTION (N/A)  Patient location during evaluation: Mother Baby Anesthesia Type: Epidural Level of consciousness: awake and alert, oriented and patient cooperative Pain management: pain level controlled Vital Signs Assessment: post-procedure vital signs reviewed and stable Respiratory status: spontaneous breathing Cardiovascular status: stable Postop Assessment: no headache, epidural receding, patient able to bend at knees and no signs of nausea or vomiting Anesthetic complications: no Comments: Pain score 1.     Last Vitals:  Vitals:   06/19/16 0350 06/19/16 0500  BP: (!) 134/94 129/90  Pulse: 91 83  Resp: 18 18  Temp: 36.6 C 36.5 C    Last Pain:  Vitals:   06/19/16 0635  TempSrc:   PainSc: 3    Pain Goal: Patients Stated Pain Goal: 0 (06/18/16 1422)               Merrilyn Puma

## 2016-06-19 NOTE — Addendum Note (Signed)
Addendum  created 06/19/16 0737 by Angela Adam, CRNA   Sign clinical note

## 2016-06-19 NOTE — Progress Notes (Signed)
UR chart review completed.  

## 2016-06-19 NOTE — Transfer of Care (Signed)
Immediate Anesthesia Transfer of Care Note  Patient: Jessica Hayden  Procedure(s) Performed: Procedure(s): CESAREAN SECTION (N/A)  Patient Location: PACU  Anesthesia Type:Epidural  Level of Consciousness: awake, alert , oriented and patient cooperative  Airway & Oxygen Therapy: Patient Spontanous Breathing  Post-op Assessment: Report given to RN, Post -op Vital signs reviewed and stable and Patient moving all extremities X 4  Post vital signs: Reviewed and stable  Last Vitals:  Vitals:   06/18/16 2251 06/18/16 2257  BP: 138/90 (!) 141/88  Pulse: (!) 124 (!) 115  Resp:  20  Temp:      Last Pain:  Vitals:   06/18/16 2104  TempSrc:   PainSc: 0-No pain      Patients Stated Pain Goal: 0 (06/18/16 1422)  Complications: No apparent anesthesia complications

## 2016-06-20 NOTE — Discharge Instructions (Signed)

## 2016-06-20 NOTE — Lactation Note (Signed)
This note was copied from a baby's chart. Lactation Consultation Note  Patient Name: Jessica Hayden [id number] Date: 06/20/2016 Reason for consult: Follow-up assessment Baby at 43 hr of life. Mom reports baby is latching for 10-15 minutes at a time then falling asleep. She denies breast or nipple pain. She is using the DEBP and feeding back milk. Baby was laying in basinet sucking a pacifier. Offered to help latch baby and mom declined. She stated baby just ate. Discussed baby behavior, feeding frequency, voids, wt loss, breast changes, and nipple care. She is aware of lactation services and support group. She will call as needed.    Maternal Data    Feeding Feeding Type: Breast Fed Length of feed: 10 min  LATCH Score/Interventions                      Lactation Tools Discussed/Used     Consult Status Consult Status: Follow-up Date: 06/21/16 Follow-up type: In-patient    MACKINZE KOPEC 06/20/2016, 6:48 PM

## 2016-06-20 NOTE — Discharge Summary (Signed)
[facility] Discharge Summary   Patient Name:   Jessica Hayden DOB:     1981-05-15 MRN:     [id number]  Date of Admission:   06/18/2016 Date of Discharge:  06/21/2016  Admitting diagnosis:    40WKS, ELEVATED BP Principal Problem:   Status post primary low transverse cesarean section--breech Active Problems:   Pregnancy induced hypertension   Gestational hypertension      Discharge diagnosis:    40WKS, ELEVATED BP Principal Problem:   Status post primary low transverse cesarean section--breech Active Problems:   Pregnancy induced hypertension   Gestational hypertension                                                                      Post partum procedures: NA  Type of Delivery:  Primary LTCS due to breech  Delivering Provider: Osborn Coho   Date of Delivery:  06/18/16  Newborn Data:    Live born female  Birth Weight: 7 lb 1.6 oz (3220 g) APGAR: 8,   Baby's Name:  Unknown Baby Feeding:   Breast Disposition:   home with mother  Complications:   None  Hospital course:      Induction of Labor With Cesarean Section  35 y.o. yo G3P1011 at [redacted]w[redacted]d was admitted to the hospital 06/18/2016 for induction of labor due to gestational hypertension. Patient had a labor course significant for discovery of breech presentation during early induction process for gestational hypertension.. The patient went for cesarean section due to Malpresentation, and delivered a Viable infant, weight 7+1, boy, normal apgars.  Membrane Rupture Time/Date: )11:27 PM ,06/18/2016   Details of operation can be found in separate operative Note.  Patient had an uncomplicated postpartum course. She is ambulating, tolerating a regular diet, passing flatus, and urinating well.  Patient is discharged home in stable condition on 06/21/16. [facility] RN will see the patient this upcoming week for BP check.  BP remained stable during hospitalization, not requiring any medications.                                 Physical Exam:   Vitals:   06/19/16 1700 06/19/16 2100 06/20/16 0615 06/20/16 1828  BP: 126/76 129/81 128/80 137/83  Pulse: 75 86 86 100  Resp: 18 18 18 18   Temp: 97.9 F (36.6 C) 98.1 F (36.7 C) 98 F (36.7 C) 98.5 F (36.9 C)  TempSrc: Oral Oral  Oral  SpO2: 98% 99%  98%  Weight:      Height:       General: alert Lochia: appropriate Uterine Fundus: firm Incision: Dressing is clean, dry, and intact DVT Evaluation: No evidence of DVT seen on physical exam. Negative Homan's sign.  Labs: CBC Latest Ref Rng & Units 06/19/2016 06/18/2016 06/18/2016  WBC 4.0 - 10.5 K/uL 17.0(H) 12.4(H) 11.8(H)  Hemoglobin 12.0 - 15.0 g/dL 6.6(Y) 11.6(L) 11.7(L)  Hematocrit 36.0 - 46.0 % 28.6(L) 32.8(L) 33.7(L)  Platelets 150 - 400 K/uL 233 223 211   CMP Latest Ref Rng & Units 06/19/2016  Glucose 65 - 99 mg/dL 403(K)  BUN 6 - 20 mg/dL 5(L)  Creatinine 7.42 - 1.00 mg/dL 5.95  Sodium 638 -  145 mmol/L 135  Potassium 3.5 - 5.1 mmol/L 3.2(L)  Chloride 101 - 111 mmol/L 106  CO2 22 - 32 mmol/L 23  Calcium 8.9 - 10.3 mg/dL 8.2(L)  Total Protein 6.5 - 8.1 g/dL 0.9(W)  Total Bilirubin 0.3 - 1.2 mg/dL 0.7  Alkaline Phos 38 - 126 U/L 118  AST 15 - 41 U/L 32  ALT 14 - 54 U/L 23    Discharge instruction: per After Visit Summary and "Baby and Me Booklet".  After Visit Meds:    Medication List    TAKE these medications   acetaminophen 325 MG tablet Commonly known as:  TYLENOL Take 650 mg by mouth every 6 (six) hours as needed for mild pain or headache.   ibuprofen 600 MG tablet Commonly known as:  ADVIL,MOTRIN Take 1 tablet (600 mg total) by mouth every 6 (six) hours as needed for mild pain.   norethindrone 0.35 MG tablet Commonly known as:  ORTHO MICRONOR Take 1 tablet (0.35 mg total) by mouth daily. Start taking on:  07/19/2016   oxyCODONE-acetaminophen 5-325 MG tablet Commonly known as:  PERCOCET/ROXICET Take 1 tablet by mouth every 4 (four) hours as needed  (pain scale 4-7).       Diet: routine diet  Activity: Advance as tolerated. Pelvic rest for 6 weeks.   Outpatient follow up:6 weeks--[facility] will see the patient early next week for BP check. Follow up Appt:No future appointments. Follow up visit: No Follow-up on file.  Postpartum contraception: Progesterone only pills  06/21/2016 Nigel Bridgeman, CNM

## 2016-06-20 NOTE — Progress Notes (Signed)
Subjective: Postpartum Day 2: Cesarean Delivery Patient reports tolerating PO and no problems voiding.    Objective: Vital signs in last 24 hours: Temp:  [97.9 F (36.6 C)-98.1 F (36.7 C)] 98 F (36.7 C) (07/29 0615) Pulse Rate:  [75-86] 86 (07/29 0615) Resp:  [18] 18 (07/29 0615) BP: (126-129)/(76-81) 128/80 (07/29 0615) SpO2:  [98 %-99 %] 99 % (07/28 2100)  Physical Exam:  General: alert and cooperative Lochia: appropriate Uterine Fundus: firm Incision: healing well, no significant drainage, no dehiscence DVT Evaluation: Negative Homan's sign.   Recent Labs  06/18/16 1937 06/19/16 0558  HGB 11.6* 9.8*  HCT 32.8* 28.6*    Assessment/Plan: Status post Cesarean section. Doing well postoperatively.  Continue current care. Blood pressures has improved Jessica Hayden A 06/20/2016, 1:41 PM

## 2016-06-20 NOTE — Clinical Social Work Maternal (Signed)
  CLINICAL SOCIAL WORK MATERNAL/CHILD NOTE  Patient Details  Name: Jessica Hayden MRN: [id number] Date of Birth: 1981/05/07  Date:  06/20/2016  Clinical Social Worker Initiating Note:  Laurey Arrow Date/ Time Initiated:  06/20/16/1012     Child's Name:  Jessica Hayden   Legal Guardian:  Mother   Need for Interpreter:  None   Date of Referral:  06/19/16     Reason for Referral:  Behavioral Health Issues, including SI    Referral Source:  Central Nursery   Address:  [place]  Phone number:  [phone]   Household Members:  Self, Minor Children, Spouse   Natural Supports (not living in the home):  Friends (limited supports; no family in [place])   Professional Supports: Publishing copy at [employer])   Employment:     Type of Work:     Education:  Database administrator Resources:  Medicaid   Other Resources:  Physicist, medical  (MOB will apply for [facility] after hospital D/C)   Cultural/Religious Considerations Which May Impact Care:  None Reported  Strengths:  Ability to meet basic needs , Home prepared for child , Pediatrician chosen , Understanding of illness   Risk Factors/Current Problems:  Mental Health Concerns    Cognitive State:  Alert , Goal Oriented , Insightful    Mood/Affect:  Calm , Happy , Relaxed , Interested    CSW Assessment: CSW met with MOB to complete an assessment for hx of depression.  MOB was polite, inviting, and receptive to meeting with CSW. MOB introduced her room guest (he was asleep on the couch), as FOB/Husband Rosaria Ferries.  MOB gave CSW permission to meet with MOB while FOB was in the room. CSW inquired about MOB's MH hx and MOB acknowledged she has a hx of depression.  MOB reported that she is currently prescribed Wellbutrin and MOB receives behavioral health counseling and medication management from [facility].  CSW praised MOB for being proactive and complying whit her  Long plan. MOB reported she has everything she needs for her infant, including a car seat and a co0.  CS-sleeper. CSW educated MOB about PPD and encouraged MOB to ask question. MOB denied PPD symptoms with her first child. CSW provided MOB of possible supports and interventions to decrease PPD.  CSW also encouraged MOB to seek medical attention if needed for increased signs and symptoms for PPD.  CSW also reviewed safe sleep, and SIDS. MOB and was knowledgeable and asked appropriate questions.   CSW provided MOB with a flyer to various parenting support groups that are offered by the hospital.  CSW Plan/Description:  Psychosocial Support and Ongoing Assessment of Needs, Information/Referral to [employer] , Patient/Family Education , No Further Intervention Required/No Barriers to Discharge    Dimple Nanas, LCSW 06/20/2016, 10:33 AM  Laurey Arrow, MSW, LCSW Clinical Social Work [phone]

## 2016-06-21 MED ORDER — OXYCODONE-ACETAMINOPHEN 5-325 MG PO TABS: 1.0000 | ORAL_TABLET | ORAL | 0 refills | Status: DC | PRN

## 2016-06-21 MED ORDER — NORETHINDRONE 0.35 MG PO TABS: 1.0000 | ORAL_TABLET | Freq: Every day | ORAL | 11 refills | Status: AC

## 2016-06-21 MED ORDER — IBUPROFEN 600 MG PO TABS: 600.0000 mg | ORAL_TABLET | Freq: Four times a day (QID) | ORAL | 2 refills | Status: AC | PRN

## 2016-06-21 NOTE — Lactation Note (Signed)
This note was copied from a baby's chart. Lactation Consultation Note Follow up consult with this mom and baby, now 49 hours old, and being discharged to home today. Mom reports breastfeeding going well. She has full breast, with lots of easily expressed transitional milk. Mom given hand pump and shown how to use. Milk flowing within a minutes. Breast care reviewed, and mom knows to cal lactation for questions/concerns.   Patient Name: Jessica Hayden [id number] Date: 06/21/2016 Reason for consult: Follow-up assessment   Maternal Data    Feeding Feeding Type: Breast Fed Length of feed: 5 min  LATCH Score/Interventions    Audible Swallowing:  (milk has begun to transition in)  Type of Nipple: Everted at rest and after stimulation  Comfort (Breast/Nipple): Soft / non-tender           Lactation Tools Discussed/Used     Consult Status Consult Status: Complete Follow-up type: Call as needed    Alfred Levins 06/21/2016, 11:46 AM

## 2016-10-11 ENCOUNTER — Encounter (HOSPITAL_COMMUNITY): Payer: Self-pay

## 2018-06-27 ENCOUNTER — Emergency Department (HOSPITAL_COMMUNITY)
Admission: EM | Admit: 2018-06-27 | Discharge: 2018-06-27 | Disposition: A | Discharge status: Home / Self Care | Payer: Medicaid Other | Attending: Emergency Medicine | Admitting: Emergency Medicine

## 2018-06-27 ENCOUNTER — Encounter (HOSPITAL_COMMUNITY): Payer: Self-pay

## 2018-06-27 ENCOUNTER — Other Ambulatory Visit: Payer: Self-pay

## 2018-06-27 ENCOUNTER — Emergency Department (HOSPITAL_COMMUNITY): Payer: Medicaid Other

## 2018-06-27 DIAGNOSIS — N10 Acute tubulo-interstitial nephritis: Secondary | ICD-10-CM | POA: Insufficient documentation

## 2018-06-27 DIAGNOSIS — N12 Tubulo-interstitial nephritis, not specified as acute or chronic: Secondary | ICD-10-CM

## 2018-06-27 DIAGNOSIS — Z79899 Other long term (current) drug therapy: Secondary | ICD-10-CM | POA: Insufficient documentation

## 2018-06-27 LAB — CBC
HCT: 36.7 % (ref 36.0–46.0)
HEMOGLOBIN: 12.3 g/dL (ref 12.0–15.0)
MCH: 31.4 pg (ref 26.0–34.0)
MCHC: 33.5 g/dL (ref 30.0–36.0)
MCV: 93.6 fL (ref 78.0–100.0)
Platelets: 309 10*3/uL (ref 150–400)
RBC: 3.92 MIL/uL (ref 3.87–5.11)
RDW: 12.6 % (ref 11.5–15.5)
WBC: 14 10*3/uL — ABNORMAL HIGH (ref 4.0–10.5)

## 2018-06-27 LAB — BASIC METABOLIC PANEL
ANION GAP: 13 (ref 5–15)
BUN: 5 mg/dL — ABNORMAL LOW (ref 6–20)
CALCIUM: 8.7 mg/dL — AB (ref 8.9–10.3)
CHLORIDE: 101 mmol/L (ref 98–111)
CO2: 22 mmol/L (ref 22–32)
Creatinine, Ser: 0.88 mg/dL (ref 0.44–1.00)
GFR calc non Af Amer: >60 mL/min (ref >60)
GLUCOSE: 103 mg/dL — AB (ref 70–99)
Potassium: 3.5 mmol/L (ref 3.5–5.1)
Sodium: 136 mmol/L (ref 135–145)

## 2018-06-27 LAB — URINALYSIS, ROUTINE W REFLEX MICROSCOPIC
BILIRUBIN URINE: NEGATIVE
GLUCOSE, UA: NEGATIVE mg/dL
Hgb urine dipstick: NEGATIVE
KETONES UR: 5 mg/dL — AB
Nitrite: NEGATIVE
PH: 6 (ref 5.0–8.0)
Protein, ur: NEGATIVE mg/dL
SPECIFIC GRAVITY, URINE: 1.01 (ref 1.005–1.030)

## 2018-06-27 LAB — POC URINE PREG, ED: PREG TEST UR: NEGATIVE

## 2018-06-27 MED ORDER — HYDROCODONE-ACETAMINOPHEN 5-325 MG PO TABS
1.0000 | ORAL_TABLET | Freq: Four times a day (QID) | ORAL | 0 refills | Status: AC | PRN
Start: 2018-06-27 — End: ?

## 2018-06-27 MED ORDER — KETOROLAC TROMETHAMINE 30 MG/ML IJ SOLN
15.0000 mg | Freq: Once | INTRAMUSCULAR | Status: AC
  Administered 2018-06-27: 15 mg via INTRAVENOUS
  Filled 2018-06-27: qty 1

## 2018-06-27 MED ORDER — CEPHALEXIN 500 MG PO CAPS: 500.0000 mg | ORAL_CAPSULE | Freq: Four times a day (QID) | ORAL | 0 refills | Status: AC

## 2018-06-27 MED ORDER — CEFTRIAXONE SODIUM 1 G IJ SOLR
1.0000 g | Freq: Once | INTRAMUSCULAR | Status: AC
  Administered 2018-06-27: 1 g via INTRAVENOUS
  Filled 2018-06-27: qty 10

## 2018-06-27 MED ORDER — SODIUM CHLORIDE 0.9 % IV BOLUS
1000.0000 mL | Freq: Once | INTRAVENOUS | Status: AC
  Administered 2018-06-27: 1000 mL via INTRAVENOUS

## 2018-06-27 NOTE — ED Triage Notes (Signed)
Pt c/o generalzied body aches, malaise, fever x 3 days. Pt reports she had a UTI a "couple of weeks ago" that she was not seen for, took OTC medication and believed it had cleared up. Pt reports painful urination and left flank pain. Fever almost 101 this morning, took tylenol around 0830 this morning.

## 2018-06-27 NOTE — ED Provider Notes (Signed)
[facility]-EMERGENCY DEPT Provider Note   CSN: [id number] Arrival date & time: 06/27/18  1214     History   Chief Complaint Chief Complaint  Patient presents with  . Fever  . Flank Pain    HPI Jessica Labellalizabeth Karg is a 37 y.o. female.  HPI   Jessica Hayden is a 37 y.o. female, with a history of depression, presenting to the ED with left flank pain beginning about 6 days ago. Accompanied by fever, chills, and body aches that started three days ago.   Dysuria beginning a little over a week ago. Took Azo and symptoms resolved after about two days, but then recurred with the flank pain. Has been taking tylenol for fever. Has had some loose stools starting today.  She notes she knows her husband has been having sex with another woman outside their marriage, the patient has continued to have sex with her husband, and she has not has STD testing.   Denies nausea, vomiting, hematuria, abnormal vaginal discharge or bleeding, or any other complaints.    Past Medical History:  Diagnosis Date  . Depression   . Headache   . Medical history non-contributory     Patient Active Problem List   Diagnosis Date Noted  . Status post primary low transverse cesarean section--breech 06/19/2016  . Pregnancy induced hypertension 06/18/2016  . Gestational hypertension 06/18/2016  . Bacterial vaginosis 12/07/2015    Past Surgical History:  Procedure Laterality Date  . APPENDECTOMY    . CESAREAN SECTION N/A 06/18/2016   Procedure: CESAREAN SECTION;  Surgeon: Osborn CohoAngela Roberts, MD;  [facility];  Service: Obstetrics;  Laterality: N/A;  . WISDOM TOOTH EXTRACTION       OB History    Gravida  3   Para  1   Term  1   Preterm      AB  1   Living  1     SAB  1   TAB      Ectopic      Multiple      Live Births  1            Home Medications    Prior to Admission medications   Medication Sig Start Date End Date Taking? Authorizing  Provider  acetaminophen (TYLENOL) 500 MG tablet Take 1,000 mg by mouth daily as needed.   Yes [provider]  TRI-LO-MARZIA 0.18/0.215/0.25 MG-25 MCG tab Take 1 tablet by mouth daily. 06/18/18  Yes [provider]  cephALEXin (KEFLEX) 500 MG capsule Take 1 capsule (500 mg total) by mouth 4 (four) times daily for 10 days. 06/27/18 07/07/18  Emmry Hinsch, Hillard DankerShawn C, PA-C  HYDROcodone-acetaminophen (NORCO/VICODIN) 5-325 MG tablet Take 1-2 tablets by mouth every 6 (six) hours as needed for severe pain. 06/27/18   Calyb Mcquarrie C, PA-C  ibuprofen (ADVIL,MOTRIN) 600 MG tablet Take 1 tablet (600 mg total) by mouth every 6 (six) hours as needed for mild pain. Patient not taking: Reported on 06/27/2018 06/21/16   Nigel BridgemanLatham, Vicki, CNM  norethindrone (ORTHO MICRONOR) 0.35 MG tablet Take 1 tablet (0.35 mg total) by mouth daily. Patient not taking: Reported on 06/27/2018 07/19/16   Nigel BridgemanLatham, Vicki, CNM    Family History Family History  Problem Relation Age of Onset  . Alcohol abuse Neg Hx   . Arthritis Neg Hx   . Asthma Neg Hx   . Birth defects Neg Hx   . Cancer Neg Hx   . COPD Neg Hx   .  Depression Neg Hx   . Diabetes Neg Hx   . Drug abuse Neg Hx   . Early death Neg Hx   . Hearing loss Neg Hx   . Heart disease Neg Hx   . Hyperlipidemia Neg Hx   . Hypertension Neg Hx   . Kidney disease Neg Hx   . Learning disabilities Neg Hx   . Mental illness Neg Hx   . Mental retardation Neg Hx   . Miscarriages / Stillbirths Neg Hx   . Stroke Neg Hx   . Vision loss Neg Hx   . Varicose Veins Neg Hx     Social History Social History   Tobacco Use  . Smoking status: Never Smoker  . Smokeless tobacco: Never Used  Substance Use Topics  . Alcohol use: No  . Drug use: No     Allergies   Patient has no known allergies.   Review of Systems Review of Systems  Constitutional: Positive for chills and fever.  Gastrointestinal: Positive for nausea. Negative for diarrhea.  Genitourinary: Positive for dysuria and  flank pain. Negative for vaginal bleeding and vaginal discharge.  Musculoskeletal: Positive for myalgias.  All other systems reviewed and are negative.    Physical Exam Updated Vital Signs BP 129/88 (BP Location: Left Arm)   Pulse (!) 110   Temp 98.5 F (36.9 C) (Oral)   Resp 18   Ht 5\' 7"  (1.702 m)   Wt 80.7 kg (178 lb)   LMP 06/17/2018   SpO2 100%   Breastfeeding? No   BMI 27.88 kg/m   Physical Exam  Constitutional: She appears well-developed and well-nourished. No distress.  HENT:  Head: Normocephalic and atraumatic.  Eyes: Conjunctivae are normal.  Neck: Neck supple.  Cardiovascular: Normal rate, regular rhythm, normal heart sounds and intact distal pulses.  Pulmonary/Chest: Effort normal and breath sounds normal. No respiratory distress.  Abdominal: Soft. There is no tenderness. There is CVA tenderness (left). There is no guarding.  Musculoskeletal: She exhibits no edema.  Lymphadenopathy:    She has no cervical adenopathy.  Neurological: She is alert.  Skin: Skin is warm and dry. She is not diaphoretic.  Psychiatric: She has a normal mood and affect. Her behavior is normal.  Nursing note and vitals reviewed.    ED Treatments / Results  Labs (all labs ordered are listed, but only abnormal results are displayed) Labs Reviewed  URINALYSIS, ROUTINE W REFLEX MICROSCOPIC - Abnormal; Notable for the following components:      Result Value   Ketones, ur 5 (*)    Leukocytes, UA TRACE (*)    Bacteria, UA FEW (*)    All other components within normal limits  BASIC METABOLIC PANEL - Abnormal; Notable for the following components:   Glucose, Bld 103 (*)    BUN 5 (*)    Calcium 8.7 (*)    All other components within normal limits  CBC - Abnormal; Notable for the following components:   WBC 14.0 (*)    All other components within normal limits  URINE CULTURE  POC URINE PREG, ED    EKG None  Radiology Ct Renal Stone Study  Result Date: 06/27/2018 CLINICAL  DATA:  37 year old female with acute LEFT flank and abdominal pain for 2 weeks with fever. EXAM: CT ABDOMEN AND PELVIS WITHOUT CONTRAST TECHNIQUE: Multidetector CT imaging of the abdomen and pelvis was performed following the standard protocol without IV contrast. COMPARISON:  None. FINDINGS: Please note that parenchymal abnormalities may be missed without  intravenous contrast. Lower chest: No acute abnormality. Hepatobiliary: The liver and gallbladder are unremarkable. No biliary dilatation. Pancreas: Unremarkable Spleen: Unremarkable Adrenals/Urinary Tract: There is bilateral ureteral wall thickening and mild periureteral and perinephric inflammation, likely representing infection. Fullness of the renal collecting systems identified. No definite focal renal abnormality noted. Nonobstructing 5 mm RIGHT LOWER pole and 2 mm mid LEFT renal calculi noted. No obstructing urinary calculi are identified. The adrenal glands and bladder are unremarkable. Stomach/Bowel: Stomach is within normal limits. No evidence of bowel wall thickening, distention, or inflammatory changes. Vascular/Lymphatic: No significant vascular findings are present. No enlarged abdominal or pelvic lymph nodes. Reproductive: Uterus and bilateral adnexa are unremarkable. Other: No free fluid, focal collection or pneumoperitoneum. Musculoskeletal: No acute or significant osseous findings. IMPRESSION: 1. Bilateral ureteral wall thickening, peri ureteral inflammation and perinephric inflammation likely representing infection. Pyelonephritis is not excluded given perinephric inflammation. Fullness of the renal collecting systems. 2. Nonobstructing bilateral renal calculi. No obstructing urinary calculi. Electronically Signed   By: Harmon Pier M.D.   On: 06/27/2018 17:57    Procedures Procedures (including critical care time)  Medications Ordered in ED Medications  sodium chloride 0.9 % bolus 1,000 mL (0 mLs Intravenous Stopped 06/27/18 1826)    ketorolac (TORADOL) 30 MG/ML injection 15 mg (15 mg Intravenous Given 06/27/18 1730)  cefTRIAXone (ROCEPHIN) 1 g in sodium chloride 0.9 % 100 mL IVPB (0 g Intravenous Stopped 06/27/18 1800)     Initial Impression / Assessment and Plan / ED Course  I have reviewed the triage vital signs and the nursing notes.  Pertinent labs & imaging results that were available during my care of the patient were reviewed by me and considered in my medical decision making (see chart for details).     Patient presents with dysuria, flank pain, and fever.  Patient is nontoxic appearing, not tachypneic, not hypotensive, maintains excellent SPO2 on room air. Some bacteriuria and pyuria noted on UA.  Only mild tachycardia.  Afebrile here in the ED.  Evidence of pyelonephritis on CT without obstructing stone. Patient notes significant improvement with how she feels overall with treatments delivered in the ED. Because of the additional information the patient provided on her husband's infidelity, pelvic exam and STD testing was offered, but patient declined and states she will follow-up with OB/GYN on this matter. The patient was given instructions for home care as well as return precautions. Patient voices understanding of these instructions, accepts the plan, and is comfortable with discharge.  Search of the narcotic database reveals last controlled substance prescription was alprazolam filled January 2019.  Final Clinical Impressions(s) / ED Diagnoses   Final diagnoses:  Pyelonephritis    ED Discharge Orders        Ordered    cephALEXin (KEFLEX) 500 MG capsule  4 times daily     06/27/18 1841    HYDROcodone-acetaminophen (NORCO/VICODIN) 5-325 MG tablet  Every 6 hours PRN     06/27/18 1841       Anselm Pancoast, PA-C 06/27/18 1928    Terrilee Files, MD 06/28/18 1946

## 2018-06-27 NOTE — Discharge Instructions (Signed)
There is evidence of an infection in the kidneys, called pyelonephritis. Please take all of your antibiotics until finished!   You may develop abdominal discomfort or diarrhea from the antibiotic.  You may help offset this with probiotics which you can buy or get in yogurt. Do not eat or take the probiotics until 2 hours after your antibiotic.   Antiinflammatory medications: Take 600 mg of ibuprofen every 6 hours or 440 mg (over the counter dose) to 500 mg (prescription dose) of naproxen every 12 hours for the next 3 days. After this time, these medications may be used as needed for pain. Take these medications with food to avoid upset stomach. Choose only one of these medications, do not take them together. Tylenol: Should you continue to have additional pain while taking the ibuprofen or naproxen, you may add in tylenol as needed. Your daily total maximum amount of tylenol from all sources should be limited to 4000mg /day for persons without liver problems, or 2000mg /day for those with liver problems. Vicodin: May take Vicodin as needed for severe pain.  Do not drive or perform other dangerous activities while taking the Vicodin.  Please note that each pill of Vicodin contains 325 mg of Tylenol and the above dosage limits apply.  Follow-up with the urologist should symptoms persist. Return to the ED for worsening pain, persistent vomiting, feeling worse overall, or any other major concerns.

## 2018-06-28 LAB — URINE CULTURE

## 2019-05-14 IMAGING — CT CT RENAL STONE PROTOCOL
2 of 4 series · 16 of 46 positions shown, 18 images · non-contrast
Comparison: None.

CLINICAL DATA: 36-year-old female with acute LEFT flank and
abdominal pain for 2 weeks with fever.

EXAM:
CT ABDOMEN AND PELVIS WITHOUT CONTRAST
TECHNIQUE: Multidetector CT imaging of the abdomen and pelvis was performed
following the standard protocol without IV contrast.

[Series 2: axial st · axial · 0.72mm/px · z∈[+1052,+1462]mm · 13 of 92 slices shown, 15 images]
[im 5/92  soft-tissue]
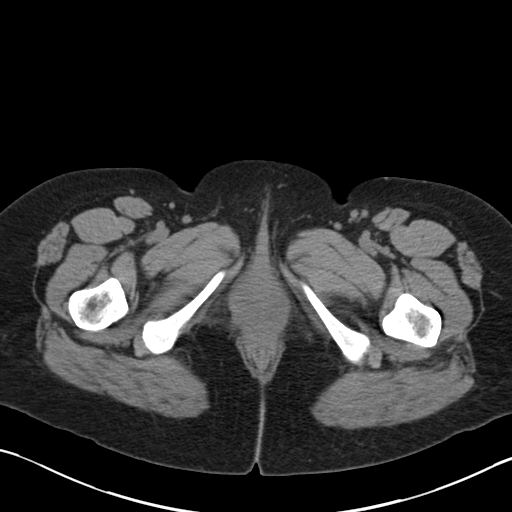
[im 5/92  bone]
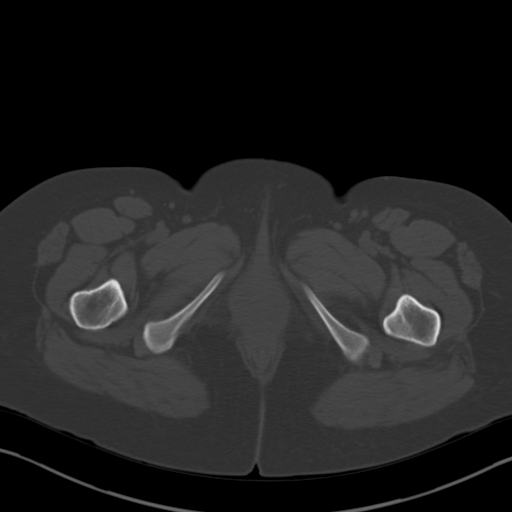
[im 15/92  soft-tissue]
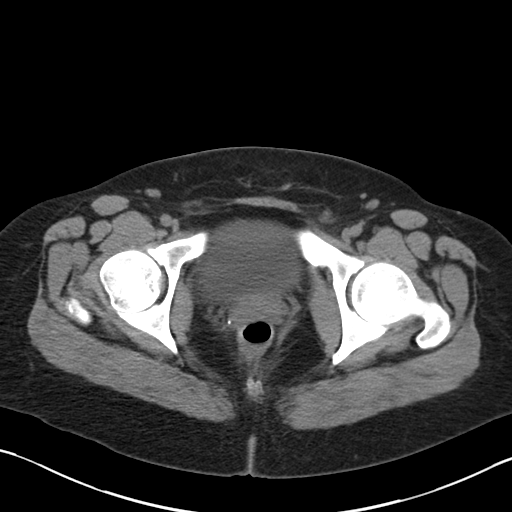
[im 20/92  soft-tissue]
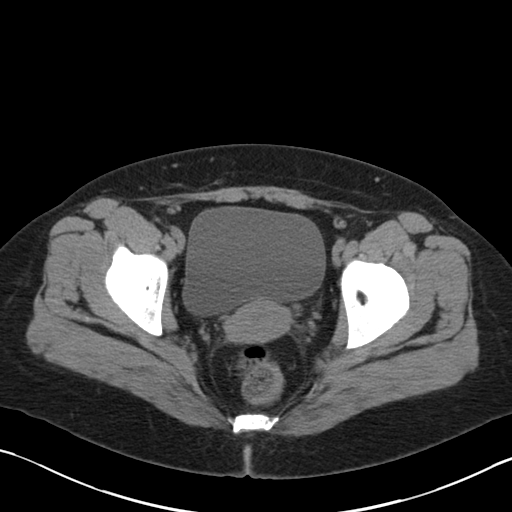
[im 24/92  soft-tissue]
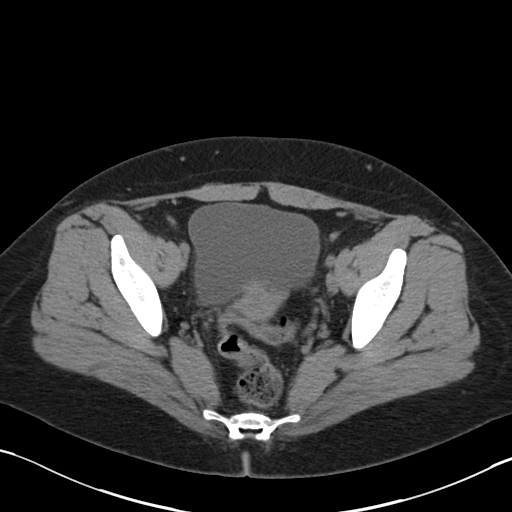
[im 34/92  soft-tissue]
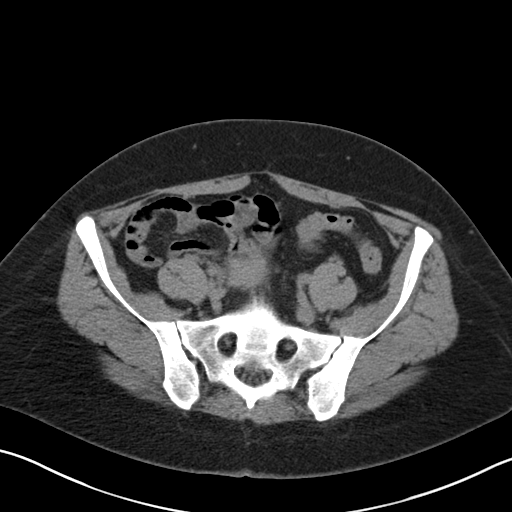
[im 39/92  soft-tissue]
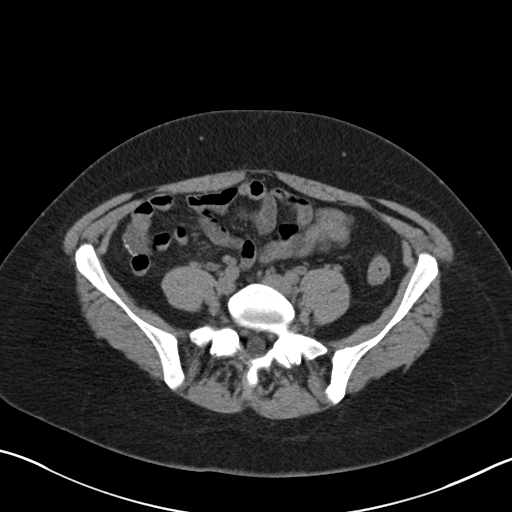
[im 48/92  soft-tissue]
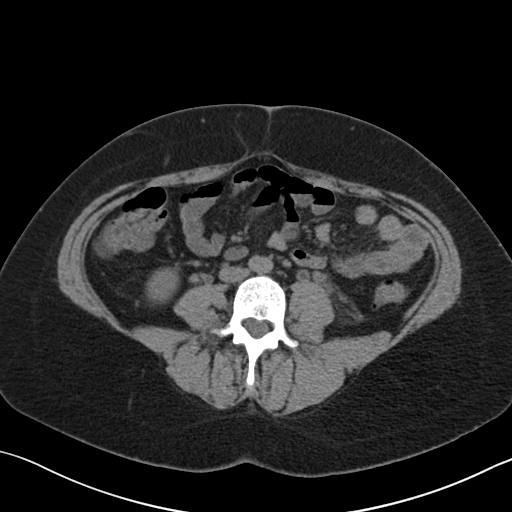
[im 53/92  soft-tissue]
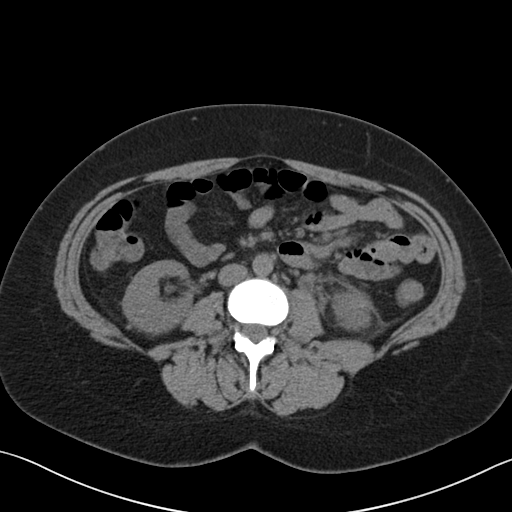
[im 58/92  soft-tissue]
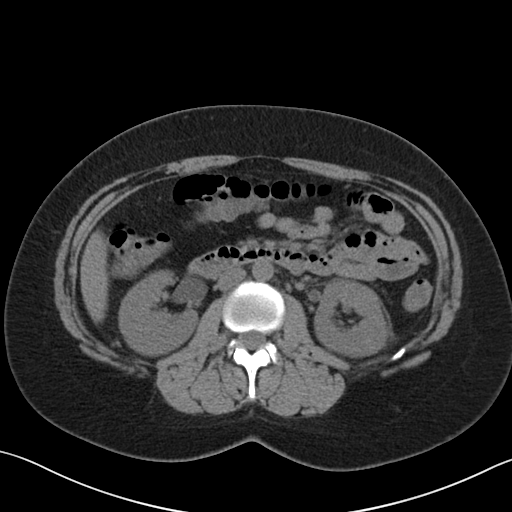
[im 58/92  bone]
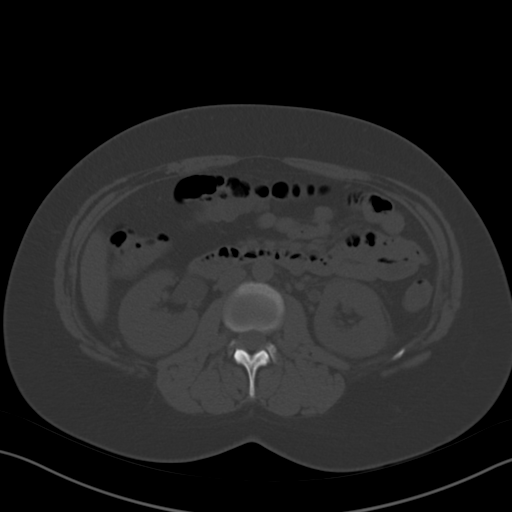
[im 68/92  soft-tissue]
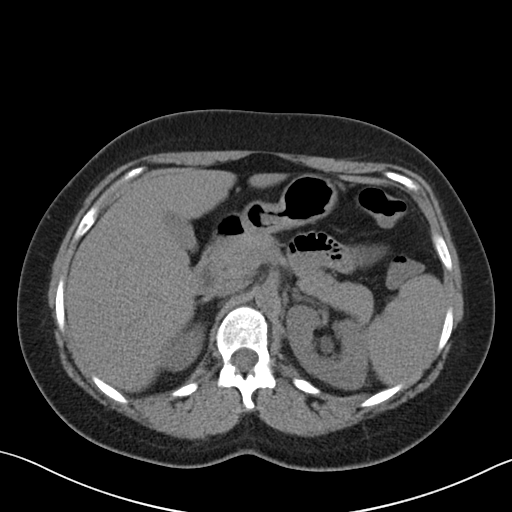
[im 72/92  soft-tissue]
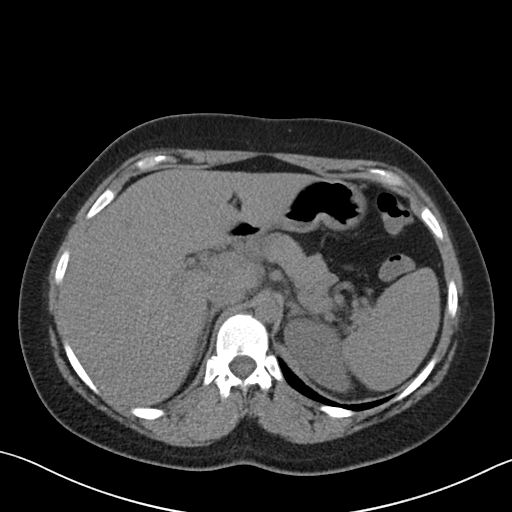
[im 77/92  soft-tissue]
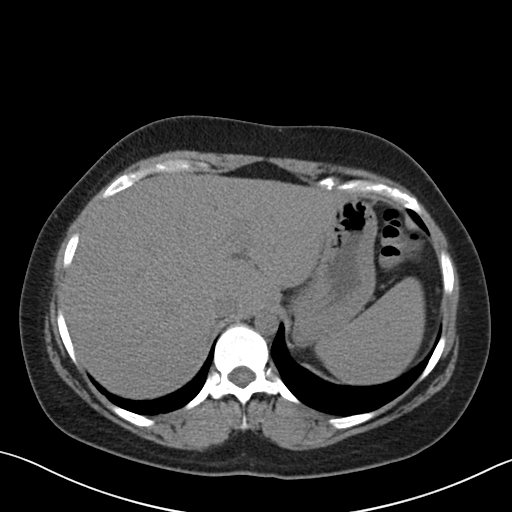
[im 87/92  soft-tissue]
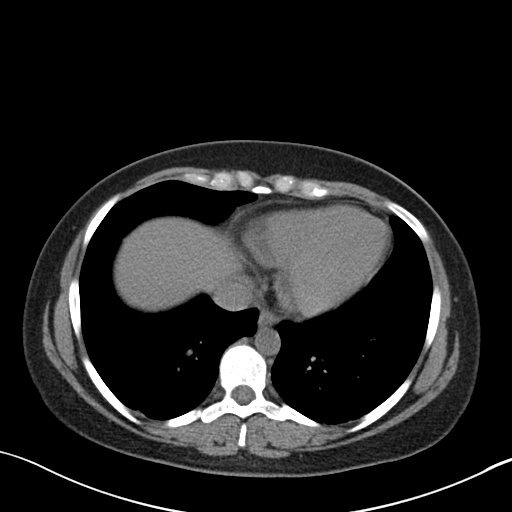

[Series 4: coronal · coronal · 0.71mm/px · 3 of 127 slices shown]
[im 43/127  soft-tissue]
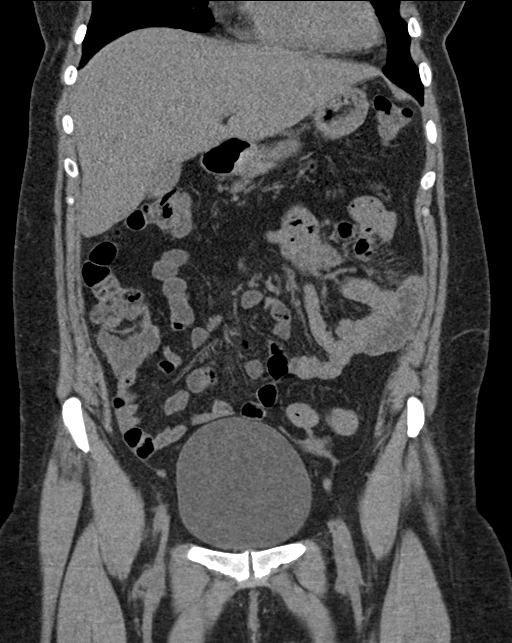
[im 57/127  soft-tissue]
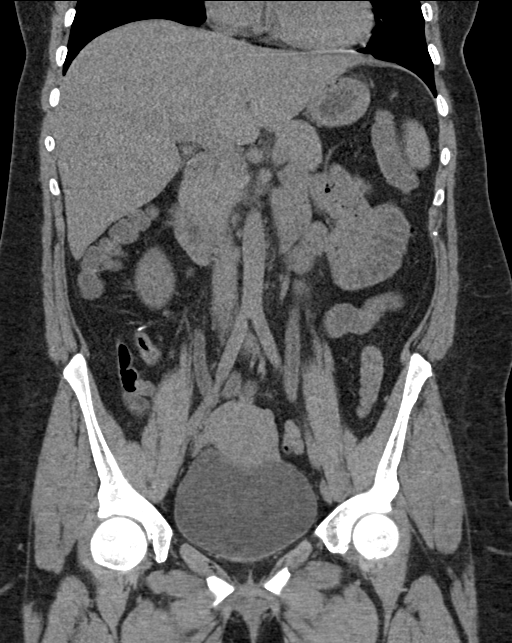
[im 71/127  soft-tissue]
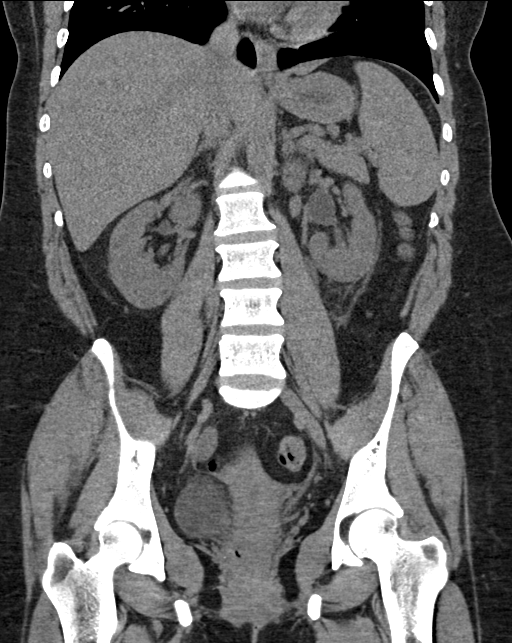

[16 of 46 positions shown; findings below may reference images not displayed]

FINDINGS: Please note that parenchymal abnormalities may be missed without
intravenous contrast.

Lower chest: No acute abnormality.

Hepatobiliary: The liver and gallbladder are unremarkable. No
biliary dilatation.

Pancreas: Unremarkable

Spleen: Unremarkable

Adrenals/Urinary Tract: There is bilateral ureteral wall thickening
and mild periureteral and perinephric inflammation, likely
representing infection. Fullness of the renal collecting systems
identified. No definite focal renal abnormality noted.

Nonobstructing 5 mm RIGHT LOWER pole and 2 mm mid LEFT renal calculi
noted. No obstructing urinary calculi are identified.

The adrenal glands and bladder are unremarkable.

Stomach/Bowel: Stomach is within normal limits. No evidence of bowel
wall thickening, distention, or inflammatory changes.

Vascular/Lymphatic: No significant vascular findings are present. No
enlarged abdominal or pelvic lymph nodes.

Reproductive: Uterus and bilateral adnexa are unremarkable.

Other: No free fluid, focal collection or pneumoperitoneum.

Musculoskeletal: No acute or significant osseous findings.
IMPRESSION: 1. Bilateral ureteral wall thickening, peri ureteral inflammation
and perinephric inflammation likely representing infection.
Pyelonephritis is not excluded given perinephric inflammation.
Fullness of the renal collecting systems.
2. Nonobstructing bilateral renal calculi. No obstructing urinary
calculi.
